# Patient Record
Sex: Female | Born: 1940
Health system: Southern US, Community
[De-identification: ages and names within clinical notes are randomized; demographics above are authoritative.]

---

## 2018-12-13 ENCOUNTER — Other Ambulatory Visit (HOSPITAL_COMMUNITY)
Admission: RE | Admit: 2018-12-13 | Discharge: 2018-12-13 | Disposition: A | Discharge status: Home / Self Care | Payer: Medicare HMO | Source: Ambulatory Visit | Attending: Infectious Diseases | Admitting: Infectious Diseases

## 2018-12-13 DIAGNOSIS — R0600 Dyspnea, unspecified: Secondary | ICD-10-CM | POA: Insufficient documentation

## 2018-12-13 DIAGNOSIS — Z955 Presence of coronary angioplasty implant and graft: Secondary | ICD-10-CM | POA: Diagnosis present

## 2018-12-13 LAB — COMPREHENSIVE METABOLIC PANEL
ALT: 28 U/L (ref 0–44)
AST: 17 U/L (ref 15–41)
Albumin: 2.7 g/dL — ABNORMAL LOW (ref 3.5–5.0)
Alkaline Phosphatase: 126 U/L (ref 38–126)
Anion gap: 10 (ref 5–15)
BUN: 13 mg/dL (ref 8–23)
CO2: 28 mmol/L (ref 22–32)
Calcium: 8.4 mg/dL — ABNORMAL LOW (ref 8.9–10.3)
Chloride: 100 mmol/L (ref 98–111)
Creatinine, Ser: 0.71 mg/dL (ref 0.44–1.00)
GFR calc Af Amer: >60 mL/min (ref >60)
GFR calc non Af Amer: >60 mL/min (ref >60)
Glucose, Bld: 289 mg/dL — ABNORMAL HIGH (ref 70–99)
Potassium: 4.1 mmol/L (ref 3.5–5.1)
Sodium: 138 mmol/L (ref 135–145)
Total Bilirubin: 0.3 mg/dL (ref 0.3–1.2)
Total Protein: 6 g/dL — ABNORMAL LOW (ref 6.5–8.1)

## 2018-12-13 LAB — CBC
HCT: 34.3 % — ABNORMAL LOW (ref 36.0–46.0)
Hemoglobin: 11 g/dL — ABNORMAL LOW (ref 12.0–15.0)
MCH: 27.9 pg (ref 26.0–34.0)
MCHC: 32.1 g/dL (ref 30.0–36.0)
MCV: 87.1 fL (ref 80.0–100.0)
Platelets: 181 10*3/uL (ref 150–400)
RBC: 3.94 MIL/uL (ref 3.87–5.11)
RDW: 13.3 % (ref 11.5–15.5)
WBC: 8.8 10*3/uL (ref 4.0–10.5)
nRBC: 0 % (ref 0.0–0.2)

## 2018-12-13 LAB — DIFFERENTIAL
Abs Immature Granulocytes: 0.05 10*3/uL (ref 0.00–0.07)
Basophils Absolute: 0 10*3/uL (ref 0.0–0.1)
Basophils Relative: 1 %
Eosinophils Absolute: 0.4 10*3/uL (ref 0.0–0.5)
Eosinophils Relative: 4 %
Immature Granulocytes: 1 %
Lymphocytes Relative: 22 %
Lymphs Abs: 1.9 10*3/uL (ref 0.7–4.0)
Monocytes Absolute: 0.6 10*3/uL (ref 0.1–1.0)
Monocytes Relative: 6 %
Neutro Abs: 5.9 10*3/uL (ref 1.7–7.7)
Neutrophils Relative %: 66 %

## 2018-12-17 ENCOUNTER — Other Ambulatory Visit (HOSPITAL_COMMUNITY)
Admission: RE | Admit: 2018-12-17 | Discharge: 2018-12-17 | Disposition: A | Discharge status: Home / Self Care | Payer: Medicare HMO | Source: Other Acute Inpatient Hospital | Attending: Infectious Diseases | Admitting: Infectious Diseases

## 2018-12-17 DIAGNOSIS — Z955 Presence of coronary angioplasty implant and graft: Secondary | ICD-10-CM | POA: Diagnosis not present

## 2018-12-17 DIAGNOSIS — R0600 Dyspnea, unspecified: Secondary | ICD-10-CM | POA: Diagnosis present

## 2018-12-17 LAB — COMPREHENSIVE METABOLIC PANEL
ALT: 30 U/L (ref 0–44)
AST: 29 U/L (ref 15–41)
Albumin: 3.2 g/dL — ABNORMAL LOW (ref 3.5–5.0)
Alkaline Phosphatase: 125 U/L (ref 38–126)
Anion gap: 11 (ref 5–15)
BUN: 16 mg/dL (ref 8–23)
CO2: 26 mmol/L (ref 22–32)
Calcium: 8.8 mg/dL — ABNORMAL LOW (ref 8.9–10.3)
Chloride: 98 mmol/L (ref 98–111)
Creatinine, Ser: 0.91 mg/dL (ref 0.44–1.00)
GFR calc Af Amer: >60 mL/min (ref >60)
GFR calc non Af Amer: >60 mL/min (ref >60)
Glucose, Bld: 281 mg/dL — ABNORMAL HIGH (ref 70–99)
Potassium: 4.8 mmol/L (ref 3.5–5.1)
Sodium: 135 mmol/L (ref 135–145)
Total Bilirubin: 0.2 mg/dL — ABNORMAL LOW (ref 0.3–1.2)
Total Protein: 6.8 g/dL (ref 6.5–8.1)

## 2018-12-17 LAB — CBC WITH DIFFERENTIAL/PLATELET
Abs Immature Granulocytes: 0.04 10*3/uL (ref 0.00–0.07)
Basophils Absolute: 0.1 10*3/uL (ref 0.0–0.1)
Basophils Relative: 1 %
Eosinophils Absolute: 0.3 10*3/uL (ref 0.0–0.5)
Eosinophils Relative: 3 %
HCT: 38.3 % (ref 36.0–46.0)
Hemoglobin: 11.9 g/dL — ABNORMAL LOW (ref 12.0–15.0)
Immature Granulocytes: 0 %
Lymphocytes Relative: 13 %
Lymphs Abs: 1.4 10*3/uL (ref 0.7–4.0)
MCH: 27.2 pg (ref 26.0–34.0)
MCHC: 31.1 g/dL (ref 30.0–36.0)
MCV: 87.4 fL (ref 80.0–100.0)
Monocytes Absolute: 0.4 10*3/uL (ref 0.1–1.0)
Monocytes Relative: 4 %
Neutro Abs: 8.1 10*3/uL — ABNORMAL HIGH (ref 1.7–7.7)
Neutrophils Relative %: 79 %
Platelets: 217 10*3/uL (ref 150–400)
RBC: 4.38 MIL/uL (ref 3.87–5.11)
RDW: 13.4 % (ref 11.5–15.5)
WBC: 10.2 10*3/uL (ref 4.0–10.5)
nRBC: 0 % (ref 0.0–0.2)

## 2018-12-24 ENCOUNTER — Other Ambulatory Visit (HOSPITAL_COMMUNITY)
Admission: RE | Admit: 2018-12-24 | Discharge: 2018-12-24 | Disposition: A | Discharge status: Home / Self Care | Payer: Medicare HMO | Source: Other Acute Inpatient Hospital | Attending: Infectious Diseases | Admitting: Infectious Diseases

## 2018-12-24 DIAGNOSIS — Z955 Presence of coronary angioplasty implant and graft: Secondary | ICD-10-CM | POA: Diagnosis not present

## 2018-12-24 DIAGNOSIS — R0600 Dyspnea, unspecified: Secondary | ICD-10-CM | POA: Insufficient documentation

## 2018-12-24 LAB — CBC WITH DIFFERENTIAL/PLATELET
Abs Immature Granulocytes: 0.02 10*3/uL (ref 0.00–0.07)
Basophils Absolute: 0.1 10*3/uL (ref 0.0–0.1)
Basophils Relative: 1 %
Eosinophils Absolute: 0.5 10*3/uL (ref 0.0–0.5)
Eosinophils Relative: 6 %
HCT: 34.9 % — ABNORMAL LOW (ref 36.0–46.0)
Hemoglobin: 10.8 g/dL — ABNORMAL LOW (ref 12.0–15.0)
Immature Granulocytes: 0 %
Lymphocytes Relative: 20 %
Lymphs Abs: 1.7 10*3/uL (ref 0.7–4.0)
MCH: 27.2 pg (ref 26.0–34.0)
MCHC: 30.9 g/dL (ref 30.0–36.0)
MCV: 87.9 fL (ref 80.0–100.0)
Monocytes Absolute: 0.5 10*3/uL (ref 0.1–1.0)
Monocytes Relative: 6 %
Neutro Abs: 5.8 10*3/uL (ref 1.7–7.7)
Neutrophils Relative %: 67 %
Platelets: 188 10*3/uL (ref 150–400)
RBC: 3.97 MIL/uL (ref 3.87–5.11)
RDW: 13.4 % (ref 11.5–15.5)
WBC: 8.6 10*3/uL (ref 4.0–10.5)
nRBC: 0 % (ref 0.0–0.2)

## 2018-12-24 LAB — COMPREHENSIVE METABOLIC PANEL
ALT: 33 U/L (ref 0–44)
AST: 20 U/L (ref 15–41)
Albumin: 2.7 g/dL — ABNORMAL LOW (ref 3.5–5.0)
Alkaline Phosphatase: 109 U/L (ref 38–126)
Anion gap: 8 (ref 5–15)
BUN: 16 mg/dL (ref 8–23)
CO2: 27 mmol/L (ref 22–32)
Calcium: 8.4 mg/dL — ABNORMAL LOW (ref 8.9–10.3)
Chloride: 101 mmol/L (ref 98–111)
Creatinine, Ser: 0.78 mg/dL (ref 0.44–1.00)
GFR calc Af Amer: >60 mL/min (ref >60)
GFR calc non Af Amer: >60 mL/min (ref >60)
Glucose, Bld: 386 mg/dL — ABNORMAL HIGH (ref 70–99)
Potassium: 4.3 mmol/L (ref 3.5–5.1)
Sodium: 136 mmol/L (ref 135–145)
Total Bilirubin: 0.7 mg/dL (ref 0.3–1.2)
Total Protein: 5.9 g/dL — ABNORMAL LOW (ref 6.5–8.1)

## 2018-12-31 ENCOUNTER — Other Ambulatory Visit (HOSPITAL_COMMUNITY)
Admission: RE | Admit: 2018-12-31 | Discharge: 2018-12-31 | Disposition: A | Discharge status: Home / Self Care | Payer: Medicare HMO | Source: Other Acute Inpatient Hospital | Attending: Infectious Diseases | Admitting: Infectious Diseases

## 2018-12-31 DIAGNOSIS — Z452 Encounter for adjustment and management of vascular access device: Secondary | ICD-10-CM | POA: Insufficient documentation

## 2018-12-31 LAB — CBC WITH DIFFERENTIAL/PLATELET
Abs Immature Granulocytes: 0.05 10*3/uL (ref 0.00–0.07)
Basophils Absolute: 0 10*3/uL (ref 0.0–0.1)
Basophils Relative: 0 %
Eosinophils Absolute: 0.2 10*3/uL (ref 0.0–0.5)
Eosinophils Relative: 2 %
HCT: 36.4 % (ref 36.0–46.0)
Hemoglobin: 11.3 g/dL — ABNORMAL LOW (ref 12.0–15.0)
Immature Granulocytes: 0 %
Lymphocytes Relative: 8 %
Lymphs Abs: 0.9 10*3/uL (ref 0.7–4.0)
MCH: 27.1 pg (ref 26.0–34.0)
MCHC: 31 g/dL (ref 30.0–36.0)
MCV: 87.3 fL (ref 80.0–100.0)
Monocytes Absolute: 0.3 10*3/uL (ref 0.1–1.0)
Monocytes Relative: 3 %
Neutro Abs: 10.2 10*3/uL — ABNORMAL HIGH (ref 1.7–7.7)
Neutrophils Relative %: 87 %
Platelets: 164 10*3/uL (ref 150–400)
RBC: 4.17 MIL/uL (ref 3.87–5.11)
RDW: 13.6 % (ref 11.5–15.5)
WBC: 11.8 10*3/uL — ABNORMAL HIGH (ref 4.0–10.5)
nRBC: 0 % (ref 0.0–0.2)

## 2018-12-31 LAB — COMPREHENSIVE METABOLIC PANEL
ALT: 34 U/L (ref 0–44)
AST: 22 U/L (ref 15–41)
Albumin: 2.9 g/dL — ABNORMAL LOW (ref 3.5–5.0)
Alkaline Phosphatase: 107 U/L (ref 38–126)
Anion gap: 9 (ref 5–15)
BUN: 20 mg/dL (ref 8–23)
CO2: 26 mmol/L (ref 22–32)
Calcium: 8.4 mg/dL — ABNORMAL LOW (ref 8.9–10.3)
Chloride: 99 mmol/L (ref 98–111)
Creatinine, Ser: 0.71 mg/dL (ref 0.44–1.00)
GFR calc Af Amer: >60 mL/min (ref >60)
GFR calc non Af Amer: >60 mL/min (ref >60)
Glucose, Bld: 397 mg/dL — ABNORMAL HIGH (ref 70–99)
Potassium: 4.5 mmol/L (ref 3.5–5.1)
Sodium: 134 mmol/L — ABNORMAL LOW (ref 135–145)
Total Bilirubin: 0.3 mg/dL (ref 0.3–1.2)
Total Protein: 6.4 g/dL — ABNORMAL LOW (ref 6.5–8.1)

## 2019-01-07 ENCOUNTER — Other Ambulatory Visit (HOSPITAL_COMMUNITY)
Admission: RE | Admit: 2019-01-07 | Discharge: 2019-01-07 | Disposition: A | Discharge status: Home / Self Care | Payer: Medicare HMO | Source: Other Acute Inpatient Hospital | Attending: Infectious Diseases | Admitting: Infectious Diseases

## 2019-01-07 DIAGNOSIS — R0600 Dyspnea, unspecified: Secondary | ICD-10-CM | POA: Insufficient documentation

## 2019-01-07 DIAGNOSIS — Z955 Presence of coronary angioplasty implant and graft: Secondary | ICD-10-CM | POA: Insufficient documentation

## 2019-01-07 LAB — CBC WITH DIFFERENTIAL/PLATELET
Abs Immature Granulocytes: 0.04 10*3/uL (ref 0.00–0.07)
Basophils Absolute: 0 10*3/uL (ref 0.0–0.1)
Basophils Relative: 0 %
Eosinophils Absolute: 0.2 10*3/uL (ref 0.0–0.5)
Eosinophils Relative: 2 %
HCT: 34.3 % — ABNORMAL LOW (ref 36.0–46.0)
Hemoglobin: 10.6 g/dL — ABNORMAL LOW (ref 12.0–15.0)
Immature Granulocytes: 1 %
Lymphocytes Relative: 13 %
Lymphs Abs: 1.1 10*3/uL (ref 0.7–4.0)
MCH: 27 pg (ref 26.0–34.0)
MCHC: 30.9 g/dL (ref 30.0–36.0)
MCV: 87.3 fL (ref 80.0–100.0)
Monocytes Absolute: 0.3 10*3/uL (ref 0.1–1.0)
Monocytes Relative: 4 %
Neutro Abs: 6.7 10*3/uL (ref 1.7–7.7)
Neutrophils Relative %: 80 %
Platelets: 154 10*3/uL (ref 150–400)
RBC: 3.93 MIL/uL (ref 3.87–5.11)
RDW: 13.7 % (ref 11.5–15.5)
WBC: 8.3 10*3/uL (ref 4.0–10.5)
nRBC: 0 % (ref 0.0–0.2)

## 2019-01-07 LAB — COMPREHENSIVE METABOLIC PANEL
ALT: 31 U/L (ref 0–44)
AST: 25 U/L (ref 15–41)
Albumin: 2.7 g/dL — ABNORMAL LOW (ref 3.5–5.0)
Alkaline Phosphatase: 92 U/L (ref 38–126)
Anion gap: 10 (ref 5–15)
BUN: 22 mg/dL (ref 8–23)
CO2: 24 mmol/L (ref 22–32)
Calcium: 8.5 mg/dL — ABNORMAL LOW (ref 8.9–10.3)
Chloride: 101 mmol/L (ref 98–111)
Creatinine, Ser: 0.74 mg/dL (ref 0.44–1.00)
GFR calc Af Amer: >60 mL/min (ref >60)
GFR calc non Af Amer: >60 mL/min (ref >60)
Glucose, Bld: 396 mg/dL — ABNORMAL HIGH (ref 70–99)
Potassium: 4.1 mmol/L (ref 3.5–5.1)
Sodium: 135 mmol/L (ref 135–145)
Total Bilirubin: 0.2 mg/dL — ABNORMAL LOW (ref 0.3–1.2)
Total Protein: 6.1 g/dL — ABNORMAL LOW (ref 6.5–8.1)

## 2019-01-14 ENCOUNTER — Other Ambulatory Visit (HOSPITAL_COMMUNITY)
Admission: RE | Admit: 2019-01-14 | Discharge: 2019-01-14 | Disposition: A | Discharge status: Home / Self Care | Payer: Medicare HMO | Source: Other Acute Inpatient Hospital | Attending: Infectious Diseases | Admitting: Infectious Diseases

## 2019-01-14 DIAGNOSIS — Z955 Presence of coronary angioplasty implant and graft: Secondary | ICD-10-CM | POA: Diagnosis not present

## 2019-01-14 DIAGNOSIS — R0600 Dyspnea, unspecified: Secondary | ICD-10-CM | POA: Diagnosis present

## 2019-01-14 LAB — COMPREHENSIVE METABOLIC PANEL
ALT: 26 U/L (ref 0–44)
AST: 23 U/L (ref 15–41)
Albumin: 3.1 g/dL — ABNORMAL LOW (ref 3.5–5.0)
Alkaline Phosphatase: 109 U/L (ref 38–126)
Anion gap: 10 (ref 5–15)
BUN: 15 mg/dL (ref 8–23)
CO2: 24 mmol/L (ref 22–32)
Calcium: 8.7 mg/dL — ABNORMAL LOW (ref 8.9–10.3)
Chloride: 103 mmol/L (ref 98–111)
Creatinine, Ser: 0.68 mg/dL (ref 0.44–1.00)
GFR calc Af Amer: >60 mL/min (ref >60)
GFR calc non Af Amer: >60 mL/min (ref >60)
Glucose, Bld: 234 mg/dL — ABNORMAL HIGH (ref 70–99)
Potassium: 4.5 mmol/L (ref 3.5–5.1)
Sodium: 137 mmol/L (ref 135–145)
Total Bilirubin: 0.4 mg/dL (ref 0.3–1.2)
Total Protein: 6.5 g/dL (ref 6.5–8.1)

## 2020-03-03 ENCOUNTER — Ambulatory Visit: Payer: Medicare HMO | Admitting: Podiatry

## 2020-03-10 ENCOUNTER — Ambulatory Visit: Payer: Medicare HMO | Admitting: Podiatry

## 2020-03-10 ENCOUNTER — Other Ambulatory Visit: Payer: Self-pay

## 2020-03-10 ENCOUNTER — Ambulatory Visit (INDEPENDENT_AMBULATORY_CARE_PROVIDER_SITE_OTHER): Payer: Medicare HMO

## 2020-03-10 ENCOUNTER — Other Ambulatory Visit: Payer: Self-pay | Admitting: Podiatry

## 2020-03-10 DIAGNOSIS — M722 Plantar fascial fibromatosis: Secondary | ICD-10-CM

## 2020-03-10 DIAGNOSIS — T8484XA Pain due to internal orthopedic prosthetic devices, implants and grafts, initial encounter: Secondary | ICD-10-CM

## 2020-03-10 DIAGNOSIS — M898X9 Other specified disorders of bone, unspecified site: Secondary | ICD-10-CM | POA: Diagnosis not present

## 2020-03-10 NOTE — Progress Notes (Signed)
  Subjective:  Patient ID: Betty Morales, female    DOB: 1940/06/06,  MRN: [id number] HPI Chief Complaint  Patient presents with  . Foot Problem    Lesion growth at LT plantar arch x 5 mo; w/ soreness - no redness/drainage Tx: Dr. Hilaria Ota inserts -worse with pressure or certain shoes    80 y.o. female presents with the above complaint.   ROS: Denies fever chills nausea vomiting muscle aches pains calf pain back pain chest pain shortness of breath.  No past medical history on file.  No current outpatient medications on file.  Allergies  Allergen Reactions  . Penicillins Anaphylaxis  . Lisinopril Other (See Comments)    HYPOTENSION HYPOTENSION HYPOTENSION   . Oxycodone-Acetaminophen Rash  . Phenobarbital Rash   Review of Systems Objective:  There were no vitals filed for this visit.  General: Well developed, nourished, in no acute distress, alert and oriented x3   Dermatological: Skin is warm, dry and supple bilateral. Nails x 10 are well maintained; remaining integument appears unremarkable at this time. There are no open sores, no preulcerative lesions, no rash or signs of infection present.  Vascular: Dorsalis Pedis artery and Posterior Tibial artery pedal pulses are 2/4 bilateral with immedate capillary fill time. Pedal hair growth present. No varicosities and no lower extremity edema present bilateral.   Neruologic: Grossly intact via light touch bilateral. Vibratory intact via tuning fork bilateral. Protective threshold with Semmes Wienstein monofilament intact to all pedal sites bilateral. Patellar and Achilles deep tendon reflexes 2+ bilateral. No Babinski or clonus noted bilateral.   Musculoskeletal: No gross boney pedal deformities bilateral. No pain, crepitus, or limitation noted with foot and ankle range of motion bilateral. Muscular strength 5/5 in all groups tested bilateral.  Large nonpulsatile mass plantar central aspect of the foot no open lesions or wounds.   There is small area of fluctuance over that but this appears to be bony.  Gait: Unassisted, Nonantalgic.    Radiographs:  Radiographs taken today demonstrate severe osteopenia with triple arthrodesis Lapidus procedure midfoot fusion internal fixation that is still present appears to be intact.  Radiographs did not demonstrate any type of bony mass on the plantar aspect of the foot due to the views.  Most likely this is hypertrophic bone from previous failed fusion.  Assessment & Plan:   Assessment: Most likely bony hypertrophy.  Plan: We are sending her for CT scan for evaluation of previously reconstructed foot with hypertrophic bone.     Idil Maslanka T. [place], [place]

## 2020-03-12 DIAGNOSIS — Z79899 Other long term (current) drug therapy: Secondary | ICD-10-CM | POA: Diagnosis not present

## 2020-03-12 DIAGNOSIS — M170 Bilateral primary osteoarthritis of knee: Secondary | ICD-10-CM | POA: Diagnosis not present

## 2020-03-12 DIAGNOSIS — Z6829 Body mass index (BMI) 29.0-29.9, adult: Secondary | ICD-10-CM | POA: Diagnosis not present

## 2020-03-12 DIAGNOSIS — R0781 Pleurodynia: Secondary | ICD-10-CM | POA: Diagnosis not present

## 2020-03-12 DIAGNOSIS — M0589 Other rheumatoid arthritis with rheumatoid factor of multiple sites: Secondary | ICD-10-CM | POA: Diagnosis not present

## 2020-03-12 DIAGNOSIS — J8410 Pulmonary fibrosis, unspecified: Secondary | ICD-10-CM | POA: Diagnosis not present

## 2020-03-12 DIAGNOSIS — M150 Primary generalized (osteo)arthritis: Secondary | ICD-10-CM | POA: Diagnosis not present

## 2020-03-12 DIAGNOSIS — E663 Overweight: Secondary | ICD-10-CM | POA: Diagnosis not present

## 2020-03-12 DIAGNOSIS — M2550 Pain in unspecified joint: Secondary | ICD-10-CM | POA: Diagnosis not present

## 2020-03-16 DIAGNOSIS — I639 Cerebral infarction, unspecified: Secondary | ICD-10-CM | POA: Diagnosis not present

## 2020-03-16 DIAGNOSIS — I252 Old myocardial infarction: Secondary | ICD-10-CM | POA: Diagnosis not present

## 2020-03-16 DIAGNOSIS — I350 Nonrheumatic aortic (valve) stenosis: Secondary | ICD-10-CM | POA: Diagnosis not present

## 2020-03-16 DIAGNOSIS — I2510 Atherosclerotic heart disease of native coronary artery without angina pectoris: Secondary | ICD-10-CM | POA: Diagnosis not present

## 2020-03-16 DIAGNOSIS — E7800 Pure hypercholesterolemia, unspecified: Secondary | ICD-10-CM | POA: Diagnosis not present

## 2020-03-16 DIAGNOSIS — G4733 Obstructive sleep apnea (adult) (pediatric): Secondary | ICD-10-CM | POA: Diagnosis not present

## 2020-03-16 DIAGNOSIS — J8410 Pulmonary fibrosis, unspecified: Secondary | ICD-10-CM | POA: Diagnosis not present

## 2020-03-16 DIAGNOSIS — I10 Essential (primary) hypertension: Secondary | ICD-10-CM | POA: Diagnosis not present

## 2020-03-17 DIAGNOSIS — R911 Solitary pulmonary nodule: Secondary | ICD-10-CM | POA: Diagnosis not present

## 2020-03-17 DIAGNOSIS — R0600 Dyspnea, unspecified: Secondary | ICD-10-CM | POA: Diagnosis not present

## 2020-03-17 DIAGNOSIS — J8410 Pulmonary fibrosis, unspecified: Secondary | ICD-10-CM | POA: Diagnosis not present

## 2020-03-17 DIAGNOSIS — G4733 Obstructive sleep apnea (adult) (pediatric): Secondary | ICD-10-CM | POA: Diagnosis not present

## 2020-03-17 DIAGNOSIS — R0602 Shortness of breath: Secondary | ICD-10-CM | POA: Diagnosis not present

## 2020-03-26 ENCOUNTER — Ambulatory Visit
Admission: RE | Admit: 2020-03-26 | Discharge: 2020-03-26 | Disposition: A | Discharge status: Home / Self Care | Payer: Medicare HMO | Source: Ambulatory Visit | Attending: Podiatry | Admitting: Podiatry

## 2020-03-26 DIAGNOSIS — Z981 Arthrodesis status: Secondary | ICD-10-CM | POA: Diagnosis not present

## 2020-03-26 DIAGNOSIS — T8484XA Pain due to internal orthopedic prosthetic devices, implants and grafts, initial encounter: Secondary | ICD-10-CM

## 2020-03-26 DIAGNOSIS — M898X9 Other specified disorders of bone, unspecified site: Secondary | ICD-10-CM

## 2020-03-26 DIAGNOSIS — R2242 Localized swelling, mass and lump, left lower limb: Secondary | ICD-10-CM | POA: Diagnosis not present

## 2020-03-26 DIAGNOSIS — M19072 Primary osteoarthritis, left ankle and foot: Secondary | ICD-10-CM | POA: Diagnosis not present

## 2020-03-26 DIAGNOSIS — Z9889 Other specified postprocedural states: Secondary | ICD-10-CM | POA: Diagnosis not present

## 2020-04-07 ENCOUNTER — Ambulatory Visit: Payer: Medicare HMO | Admitting: Podiatry

## 2020-04-07 ENCOUNTER — Other Ambulatory Visit: Payer: Self-pay

## 2020-04-07 ENCOUNTER — Encounter: Payer: Self-pay | Admitting: Podiatry

## 2020-04-07 DIAGNOSIS — T8484XA Pain due to internal orthopedic prosthetic devices, implants and grafts, initial encounter: Secondary | ICD-10-CM

## 2020-04-07 DIAGNOSIS — M898X9 Other specified disorders of bone, unspecified site: Secondary | ICD-10-CM

## 2020-04-07 NOTE — Progress Notes (Signed)
She presents today for follow-up of her CT results she states that the lesion is getting smaller and is less painful.  States that she was in [place] for a month and he was feeling much better.  Objective: Vital signs are stable alert and oriented x3 currently the lesion appears to be much smaller than it did previously there is still a palpable firm mass present no open lesions or wounds noted.  CT does demonstrate what appears to be rheumatoid nodules subcutaneous without significant bony prominences.  No signs of infection.  Assessment rheumatoid nodule plantar aspect left foot.  Plan: I recommended to her that at this point since it appears to be getting smaller we will let it get as small as it possibly can get and then once it stops reducing in size we will inject it with Kenalog try to shrink it prior to doing any type of surgical intervention.  She agrees with this and will follow up with me as needed

## 2020-05-25 DIAGNOSIS — R42 Dizziness and giddiness: Secondary | ICD-10-CM | POA: Diagnosis not present

## 2020-05-25 DIAGNOSIS — E1142 Type 2 diabetes mellitus with diabetic polyneuropathy: Secondary | ICD-10-CM | POA: Diagnosis not present

## 2020-05-25 DIAGNOSIS — Z794 Long term (current) use of insulin: Secondary | ICD-10-CM | POA: Diagnosis not present

## 2020-06-15 DIAGNOSIS — M150 Primary generalized (osteo)arthritis: Secondary | ICD-10-CM | POA: Diagnosis not present

## 2020-06-15 DIAGNOSIS — M2550 Pain in unspecified joint: Secondary | ICD-10-CM | POA: Diagnosis not present

## 2020-06-15 DIAGNOSIS — Z79899 Other long term (current) drug therapy: Secondary | ICD-10-CM | POA: Diagnosis not present

## 2020-06-15 DIAGNOSIS — E669 Obesity, unspecified: Secondary | ICD-10-CM | POA: Diagnosis not present

## 2020-06-15 DIAGNOSIS — M0589 Other rheumatoid arthritis with rheumatoid factor of multiple sites: Secondary | ICD-10-CM | POA: Diagnosis not present

## 2020-06-15 DIAGNOSIS — Z6830 Body mass index (BMI) 30.0-30.9, adult: Secondary | ICD-10-CM | POA: Diagnosis not present

## 2020-06-15 DIAGNOSIS — J8410 Pulmonary fibrosis, unspecified: Secondary | ICD-10-CM | POA: Diagnosis not present

## 2020-06-25 ENCOUNTER — Other Ambulatory Visit: Payer: Self-pay

## 2020-06-25 ENCOUNTER — Ambulatory Visit: Payer: Medicare HMO | Admitting: Podiatry

## 2021-06-08 IMAGING — CT CT FOOT*L* W/O CM
3 of 4 series · 10 of 33 positions shown, 12 images · non-contrast
Comparison: Radiographs 03/10/2020

CLINICAL DATA: Palpable lump on the plantar aspect of the foot.
History of multiple previous surgeries.

EXAM:
CT OF THE LEFT FOOT WITHOUT CONTRAST
TECHNIQUE: Multidetector CT imaging of the left foot was performed according to
the standard protocol. Multiplanar CT image reconstructions were
also generated.

[Series 8: lfov lower extremity 0.60 br40 s3 soft thin · axial · 0.54mm/px · z∈[+677,+728]mm · 2 of 215 slices shown, 3 images]
[im 65/215  soft-tissue]
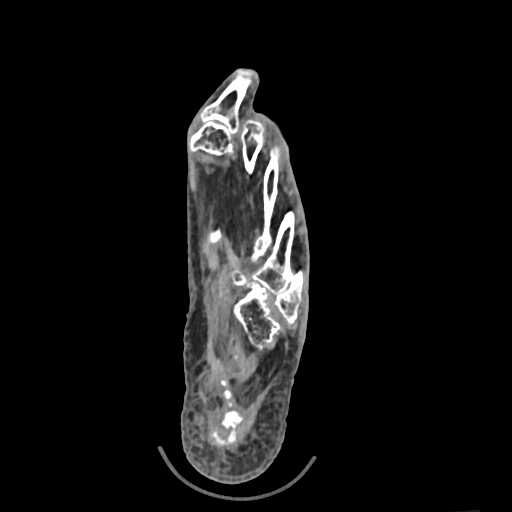
[im 65/215  bone]
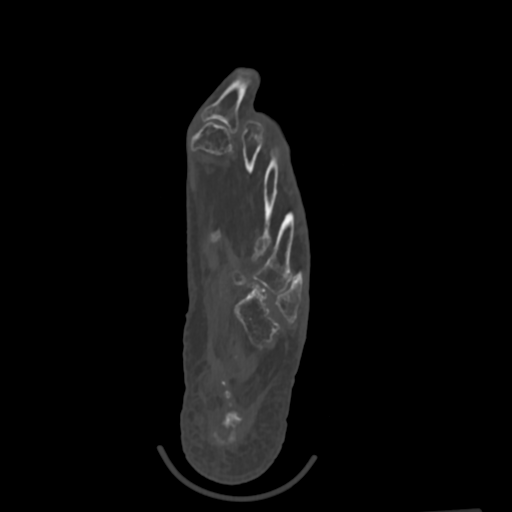
[im 150/215  bone]
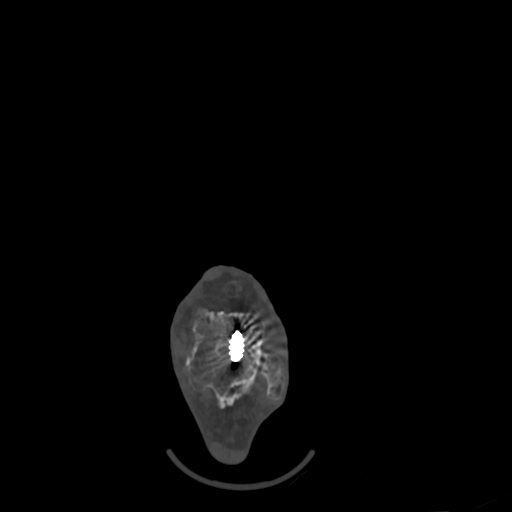

[Series 11: lfov lower extremity 2.00 br40 s3 soft · coronal · 0.25mm/px · 3 of 127 slices shown (1 of 2)]
[im 26/127  bone]
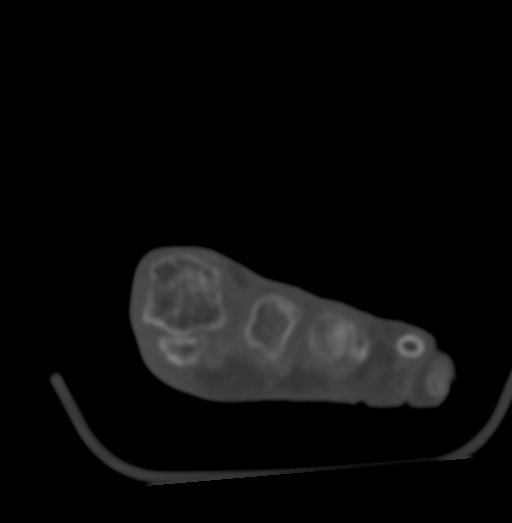
[im 51/127  bone]
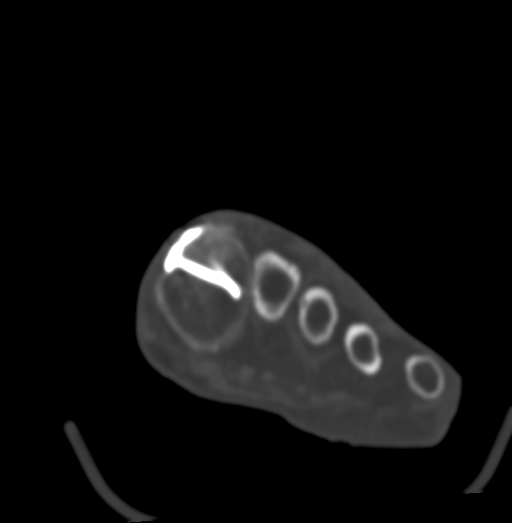
[im 76/127  bone]
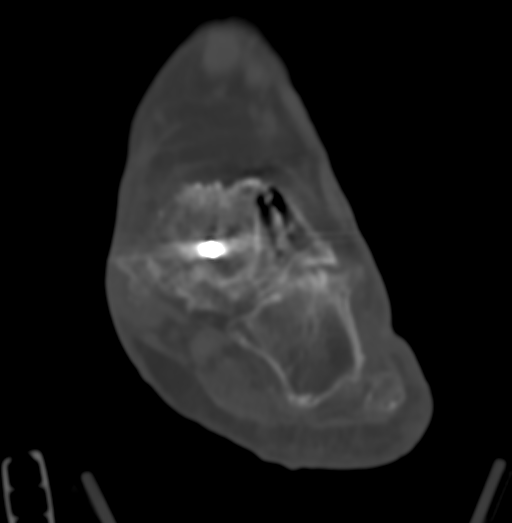

[Series 15: lfov lower extremity 2.00 br40 s3 soft · sagittal · 0.25mm/px · 5 of 63 slices shown, 6 images (2 of 2)]
[im 21/63  bone]
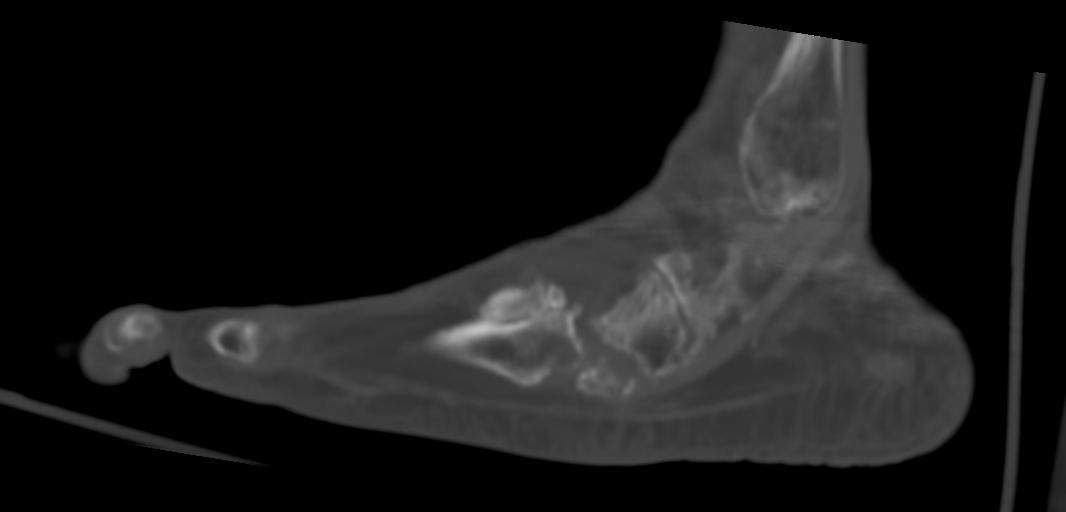
[im 26/63  bone]
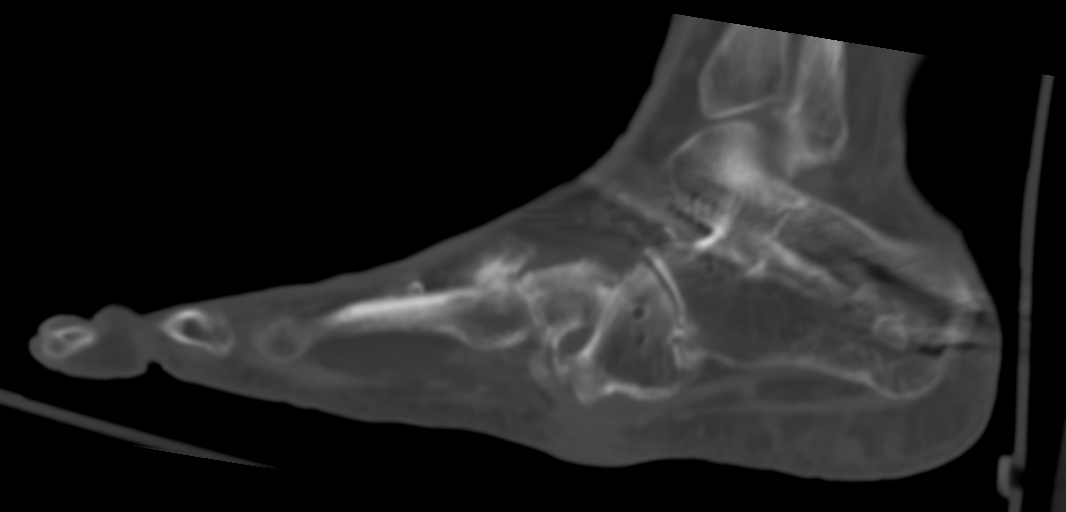
[im 32/63  soft-tissue]
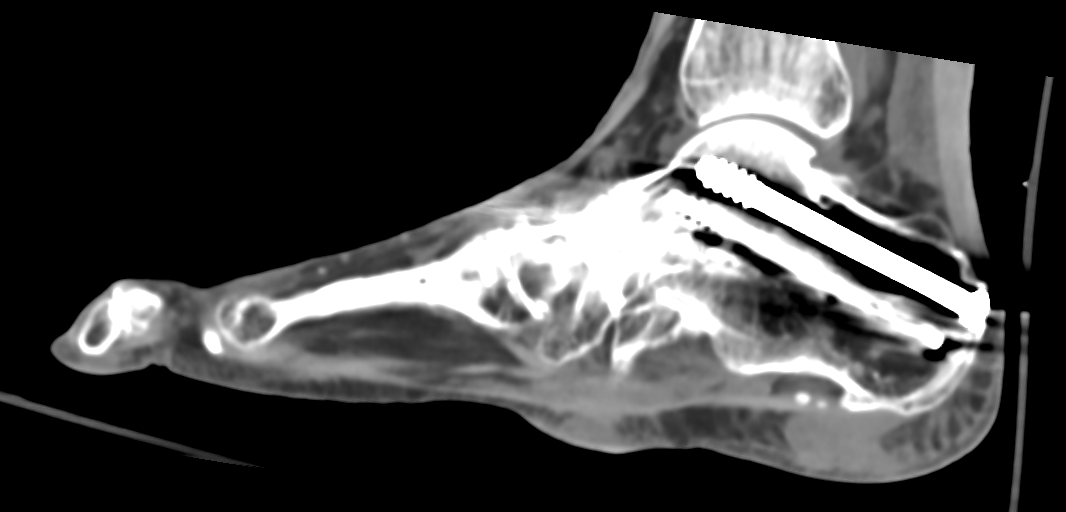
[im 32/63  bone]
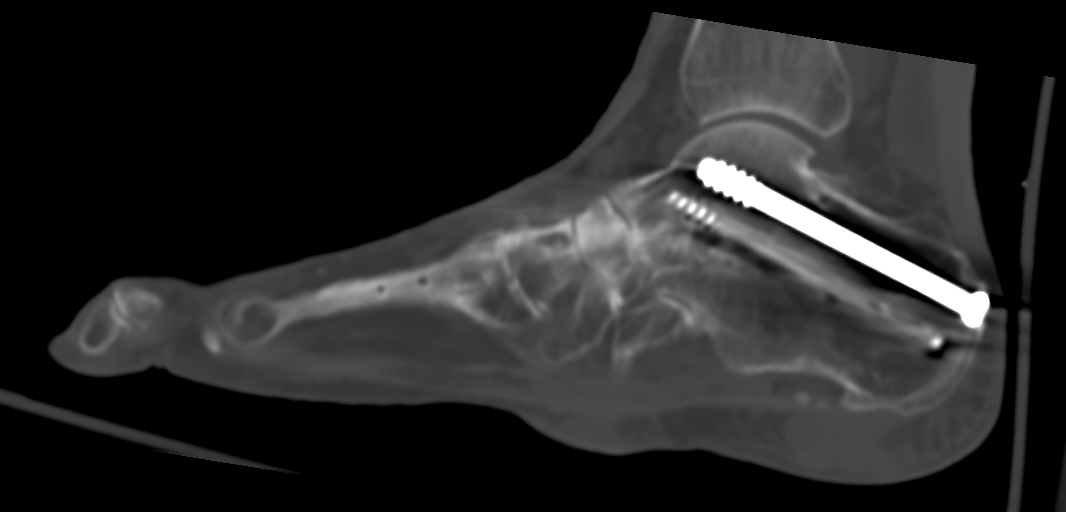
[im 37/63  bone]
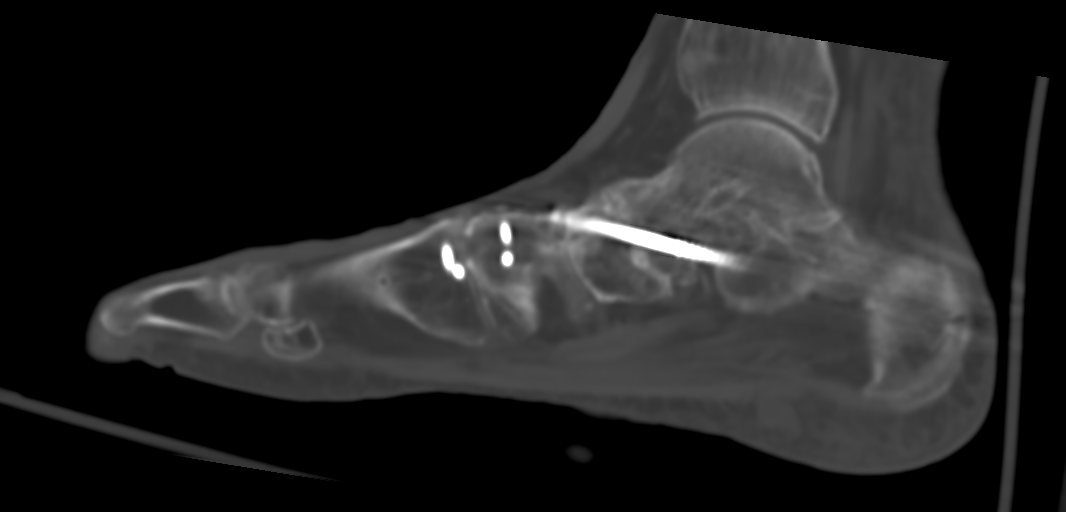
[im 42/63  bone]
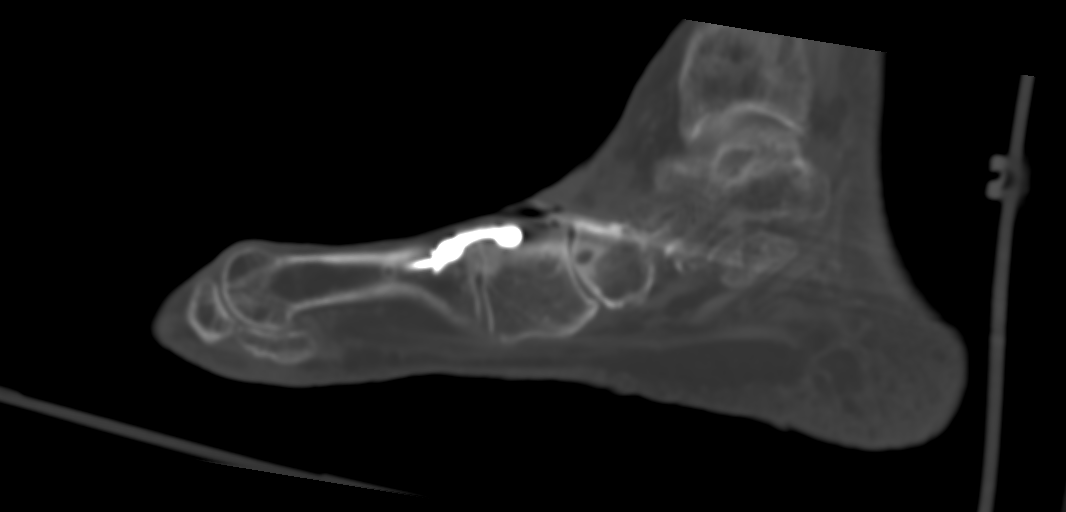

[10 of 33 positions shown; findings below may reference images not displayed]

FINDINGS: Extensive postoperative changes with hardware noted in the midfoot
and hindfoot regions.

There are 2 talocalcaneal screws related to a subtalar fusion. There
appears to be fairly solid bony fusion. There is also a screw across
the talonavicular joint which extends into the calcaneus also.
Incomplete fusion at this joint.

Fusion hardware noted across the first tarsal metatarsal joint with
fusion dorsally. Evidence of other areas of hardware removal.

Fairly extensive midfoot degenerative changes with partial fusion of
the talus with the lateral cuneiform and fusion across the lateral
cuneiform third metatarsal joint.

No acute bony findings such as fracture or osteomyelitis.

The patient's palpable abnormality is marked with a vitamin-E
capsule. There are actually several soft tissue nodules in the
plantar subcutaneous fat. These all have the same appearance and are
nonspecific. Although there are slight bony prominences along the
plantar aspect of the foot these do not measure fluid attenuation
and are un likely areas of adventitial bursa formation. They measure
is soft tissue nodules

The patient's palpable is located on the plantar aspect near the
cuboid and measures a maximum of 2 cm. More laterally there is a
smaller nodule located along the plantar aspect of the fifth
metatarsal base which measures 13 mm.

Larger lesion along the plantar aspect of the calcaneus measures
cm. There is also a smaller nodule more anteriorly measuring 11 mm.
No specific CT imaging features. With history of rheumatoid
arthritis it is possible these are rheumatoid nodules. They do not
have features of plantar fibromatosis.

Advanced fatty atrophy of the foot and ankle musculature is noted.

Mild degenerative changes at the tibiotalar joint.
IMPRESSION: 1. Extensive postoperative changes involving the midfoot and
hindfoot regions as discussed above.
2. Several soft tissue nodules in the plantar subcutaneous tissues
as discussed above. These are nonspecific. With history of
rheumatoid arthritis it is possible these are rheumatoid nodules.
They do not have the appearance of adventitial bursa or plantar
fibromatosis.
3. Advanced fatty atrophy of the foot and ankle musculature.

## 2022-06-08 ENCOUNTER — Other Ambulatory Visit: Payer: Self-pay

## 2022-06-08 ENCOUNTER — Encounter (HOSPITAL_BASED_OUTPATIENT_CLINIC_OR_DEPARTMENT_OTHER): Payer: Medicare (Managed Care) | Attending: Internal Medicine | Admitting: Physician Assistant

## 2022-06-08 DIAGNOSIS — J8410 Pulmonary fibrosis, unspecified: Secondary | ICD-10-CM | POA: Insufficient documentation

## 2022-06-08 DIAGNOSIS — I2510 Atherosclerotic heart disease of native coronary artery without angina pectoris: Secondary | ICD-10-CM | POA: Insufficient documentation

## 2022-06-08 DIAGNOSIS — G8193 Hemiplegia, unspecified affecting right nondominant side: Secondary | ICD-10-CM | POA: Diagnosis not present

## 2022-06-08 DIAGNOSIS — Z7901 Long term (current) use of anticoagulants: Secondary | ICD-10-CM | POA: Diagnosis not present

## 2022-06-08 DIAGNOSIS — L97312 Non-pressure chronic ulcer of right ankle with fat layer exposed: Secondary | ICD-10-CM | POA: Diagnosis not present

## 2022-06-08 DIAGNOSIS — I10 Essential (primary) hypertension: Secondary | ICD-10-CM | POA: Insufficient documentation

## 2022-06-08 DIAGNOSIS — L97512 Non-pressure chronic ulcer of other part of right foot with fat layer exposed: Secondary | ICD-10-CM | POA: Insufficient documentation

## 2022-06-08 DIAGNOSIS — I480 Paroxysmal atrial fibrillation: Secondary | ICD-10-CM | POA: Insufficient documentation

## 2022-06-08 DIAGNOSIS — L97522 Non-pressure chronic ulcer of other part of left foot with fat layer exposed: Secondary | ICD-10-CM | POA: Insufficient documentation

## 2022-06-08 DIAGNOSIS — E11621 Type 2 diabetes mellitus with foot ulcer: Secondary | ICD-10-CM | POA: Diagnosis present

## 2022-06-09 NOTE — Progress Notes (Signed)
Betty, Morales ([id number]) [id number] Page 1 of 4 Visit Report for 06/08/2022 Abuse Risk Screen Details Patient Name: Date of Service: Betty Morales [facility] RA 06/08/2022 1:30 PM Medical Record Number: [id number] Patient Account Number: [id number] Date of Birth/Sex: Treating RN: 04/03/1940 (82 y.o. Gevena Morales Primary Care Rebeca Valdivia: Tracey Harries Other Clinician: Referring Kylinn Shropshire: Treating Hildy Nicholl/Extender: Vaughan Sine in Treatment: 0 Abuse Risk Screen Items Answer ABUSE RISK SCREEN: Has anyone close to you tried to hurt or harm you recentlyo No Do you feel uncomfortable with anyone in your familyo No Has anyone forced you do things that you didnt want to doo No Electronic Signature(s) Signed: 06/09/2022 1:56:54 PM By: Brenton Grills Entered By: Brenton Grills on 06/08/2022 16:43:10 -------------------------------------------------------------------------------- Activities of Daily Living Details Patient Name: Date of Service: Betty Morales [facility] RA 06/08/2022 1:30 PM Medical Record Number: [id number] Patient Account Number: [id number] Date of Birth/Sex: Treating RN: 1941/01/29 (82 y.o. Gevena Morales Primary Care Arizbeth Cawthorn: Tracey Harries Other Clinician: Referring Reilynn Lauro: Treating Jacey Pelc/Extender: Vaughan Sine in Treatment: 0 Activities of Daily Living Items Answer Activities of Daily Living (Please select one for each item) Drive Automobile Not Able T Medications ake Need Assistance Use T elephone Need Assistance Care for Appearance Need Assistance Use T oilet Need Assistance Bath / Shower Need Assistance Dress Self Need Assistance Feed Self Need Assistance Walk Need Assistance Get In / Out Bed Need Assistance Housework Need Assistance Prepare Meals Need Assistance Handle Money Need Assistance Shop for Self Need Assistance Electronic Signature(s) Signed: 06/09/2022 1:56:54 PM By:  Brenton Grills Entered By: Brenton Grills on 06/08/2022 14:01:06 -------------------------------------------------------------------------------- Education Screening Details Patient Name: Date of Service: Betty Morales, [facility] RBA RA 06/08/2022 1:30 PM Medical Record Number: [id number] Patient Account Number: [id number] Date of Birth/Sex: Treating RN: [birth year] (82 y.o. Gevena Morales Primary Care Farrah Skoda: Tracey Harries Other Clinician: Referring Kenna Seward: Treating Karsen Nakanishi/Extender: Vaughan Sine in TreatmentZIMAL, WEISENSEL ([id number]) [id number] Page 2 of 4 Learning Preferences/Education Level/Primary Language Highest Education Level: College or Above Preferred Language: Economist Language Barrier: No Translator Needed: No Memory Deficit: No Emotional Barrier: No Cultural/Religious Beliefs Affecting Medical Care: No Physical Barrier Impaired Vision: Yes Glasses Impaired Hearing: No Decreased Hand dexterity: No Knowledge/Comprehension Knowledge Level: Medium Comprehension Level: Medium Ability to understand written instructions: Medium Ability to understand verbal instructions: Medium Motivation Anxiety Level: Calm Cooperation: Cooperative Education Importance: Acknowledges Need Interest in Health Problems: Asks Questions Perception: Coherent Willingness to Engage in Self-Management High Activities: Readiness to Engage in Self-Management High Activities: Electronic Signature(s) Signed: 06/09/2022 1:56:54 PM By: Brenton Grills Entered By: Brenton Grills on 06/08/2022 14:02:14 -------------------------------------------------------------------------------- Fall Risk Assessment Details Patient Name: Date of Service: Betty Morales, BA RBA RA 06/08/2022 1:30 PM Medical Record Number: [id number] Patient Account Number: [id number] Date of Birth/Sex: Treating RN: 04-Jan-1941 (82 y.o. Gevena Morales Primary Care  Arath Kaigler: Tracey Harries Other Clinician: Referring Marien Manship: Treating Rea Kalama/Extender: Vaughan Sine in Treatment: 0 Fall Risk Assessment Items Have you had 2 or more falls in the last 12 monthso 0 No Have you had any fall that resulted in injury in the last 12 monthso 0 Yes FALLS RISK SCREEN History of falling - immediate or within 3 months 25 Yes Secondary diagnosis (Do you have 2 or more medical diagnoseso) 15 Yes Ambulatory aid None/bed rest/wheelchair/nurse 0 Yes Crutches/cane/walker 0 No Furniture 0 No Intravenous therapy Access/Saline/Heparin Lock 0 No Gait/Transferring Normal/ bed  rest/ wheelchair 0 No Weak (short steps with or without shuffle, stooped but able to lift head while walking, may seek 0 No support from furniture) Impaired (short steps with shuffle, may have difficulty arising from chair, head down, impaired 20 Yes balance) Mental Status Oriented to own ability 0 No Electronic Signature(sLARAMIE, Morales ([id number]) [id number] Page 3 of 4 Signed: 06/09/2022 1:56:54 PM By: Brenton Grills Entered By: Brenton Grills on 06/08/2022 14:03:05 -------------------------------------------------------------------------------- Foot Assessment Details Patient Name: Date of Service: Betty Morales, [place] RBA RA 06/08/2022 1:30 PM Medical Record Number: [id number] Patient Account Number: [id number] Date of Birth/Sex: Treating RN: 1940/04/07 (82 y.o. Gevena Morales Primary Care Luvern Mischke: Tracey Harries Other Clinician: Referring Lilliana Turner: Treating Darely Becknell/Extender: Vaughan Sine in Treatment: 0 Foot Assessment Items  Unable to perform due to altered mental status Site Locations + = Sensation present, - = Sensation absent, C = Callus, U = Ulcer R = Redness, W = Warmth, M = Maceration, PU = Pre-ulcerative lesion F = Fissure, S = Swelling, D = Dryness Assessment Right: Left: Other Deformity: No  No Prior Foot Ulcer: No No Prior Amputation: No No Charcot Joint: No No Ambulatory Status: Gait: Electronic Signature(s) Signed: 06/09/2022 1:56:54 PM By: Brenton Grills Entered By: Brenton Grills on 06/08/2022 14:09:39 -------------------------------------------------------------------------------- Nutrition Risk Screening Details Patient Name: Date of Service: Betty Morales [facility] RA 06/08/2022 1:30 PM Medical Record Number: [id number] Patient Account Number: [id number] Date of Birth/Sex: Treating RN: 03-05-1940 (82 y.o. Gevena Morales Primary Care Nyanna Heideman: Tracey Harries Other Clinician: Referring Chantella Creech: Treating Jalena Vanderlinden/Extender: Vaughan Sine in Treatment: 0 Height (in): 67 Weight (lbs): 125 Body Mass Index (BMI): 19.6 Carriero, Kathyleen ([id number]) [phone] [id number] Page 4 of 4 Nutrition Risk Screening Items Score Screening NUTRITION RISK SCREEN: I have an illness or condition that made me change the kind and/or amount of food I eat 0 No I eat fewer than two meals per day 0 No I eat few fruits and vegetables, or milk products 0 No I have three or more drinks of beer, liquor or wine almost every day 0 No I have tooth or mouth problems that make it hard for me to eat 0 No I don't always have enough money to buy the food I need 0 No I eat alone most of the time 0 No I take three or more different prescribed or over-the-counter drugs a day 0 No Without wanting to, I have lost or gained 10 pounds in the last six months 0 No I am not always physically able to shop, cook and/or feed myself 0 No Nutrition Protocols Good Risk Protocol Moderate Risk Protocol High Risk Proctocol Risk Level: Good Risk Score: 0 Electronic Signature(s) Signed: 06/09/2022 1:56:54 PM By: Brenton Grills Entered By: Brenton Grills on 06/08/2022 14:03:31

## 2022-06-09 NOTE — Progress Notes (Signed)
KELISHA, DALL ([id number]) [id number] Page 1 of 16 Visit Report for 06/08/2022 Allergy List Details Patient Name: Date of Service: [name], [place] [facility] RA 06/08/2022 1:30 PM Medical Record Number: [id number] Patient Account Number: [id number] Date of Birth/Sex: Treating RN: 02-23-1940 (82 y.o. Gevena Mart Primary Care Akyla Vavrek: Tracey Harries Other Clinician: Referring Eilee Schader: Treating Dellar Traber/Extender: Vaughan Sine in Treatment: 0 Allergies Active Allergies penicillin G Reaction: anaphlaxis Severity: Severe Active: 04/12/2016 etanercept Reaction: injection reaction Active: 12/18/2020 infliximab Reaction: felt poorly Active: 12/18/2020 lisinopril Reaction: hypotension Active: 05/12/2015 phenobarbital Reaction: rash tramadol Reaction: acted different Active: 08/16/2021 Allergy Notes Electronic Signature(s) Signed: 06/09/2022 1:56:54 PM By: Brenton Grills Entered By: Brenton Grills on 06/08/2022 13:49:54 -------------------------------------------------------------------------------- Arrival Information Details Patient Name: Date of Service: [name], [facility] RBA RA 06/08/2022 1:30 PM Medical Record Number: [id number] Patient Account Number: [id number] Date of Birth/Sex: Treating RN: 1940/12/01 (82 y.o. Gevena Mart Primary Care Gussie Murton: Tracey Harries Other Clinician: Referring Rondle Lohse: Treating Marylene Masek/Extender: Vaughan Sine in Treatment: 0 Visit Information Patient Arrived: Wheel Chair Arrival Time: 13:43 Accompanied By: son Transfer Assistance: EasyPivot Patient Lift Patient Identification Verified: Yes Secondary Verification Process Completed: Yes Patient Requires Transmission-Based Precautions: No Patient Has Alerts: Yes Patient Alerts: Patient on Blood Thinner Patient on Eliquis Electronic Signature(s) Signed: 06/08/2022 5:06:53 PM By: Wynonia Hazard, Britta Mccreedy ([id number]) PM By:  Allen Derry PA-C [phone].pdf Page 2 of 16 Signed: 06/08/2022 5:06:53 Entered By: Allen Derry on 06/08/2022 17:06:52 -------------------------------------------------------------------------------- Clinic Level of Care Assessment Details Patient Name: Date of Service: Raylene Miyamoto [facility] RA 06/08/2022 1:30 PM Medical Record Number: [id number] Patient Account Number: [id number] Date of Birth/Sex: Treating RN: [birth year] (82 y.o. Gevena Mart Primary Care Jameca Chumley: Tracey Harries Other Clinician: Referring Yasuko Lapage: Treating Emigdio Wildeman/Extender: Vaughan Sine in Treatment: 0 Clinic Level of Care Assessment Items TOOL 1 Quantity Score X- 1 0 Use when EandM and Procedure is performed on INITIAL visit ASSESSMENTS - Nursing Assessment / Reassessment X- 1 20 General Physical Exam (combine w/ comprehensive assessment (listed just below) when performed on new pt. evals) X- 1 25 Comprehensive Assessment (HX, ROS, Risk Assessments, Wounds Hx, etc.) ASSESSMENTS - Wound and Skin Assessment / Reassessment X- 1 10 Dermatologic / Skin Assessment (not related to wound area) ASSESSMENTS - Ostomy and/or Continence Assessment and Care  - 0 Incontinence Assessment and Management  - 0 Ostomy Care Assessment and Management (repouching, etc.) PROCESS - Coordination of Care  - 0 Simple Patient / Family Education for ongoing care X- 1 20 Complex (extensive) Patient / Family Education for ongoing care X- 1 10 Staff obtains Chiropractor, Records, T Results / Process Orders est X- 1 10 Staff telephones HHA, Nursing Homes / Clarify orders / etc  - 0 Routine Transfer to another Facility (non-emergent condition)  - 0 Routine Hospital Admission (non-emergent condition) X- 1 15 New Admissions / Manufacturing engineer / Ordering NPWT Apligraf, etc. ,  - 0 Emergency Hospital Admission (emergent condition) PROCESS - Special Needs  - 0 Pediatric /  Minor Patient Management  - 0 Isolation Patient Management  - 0 Hearing / Language / Visual special needs  - 0 Assessment of Community assistance (transportation, D/C planning, etc.)  - 0 Additional assistance / Altered mentation  - 0 Support Surface(s) Assessment (bed, cushion, seat, etc.) INTERVENTIONS - Miscellaneous  - 0 External ear exam  - 0 Patient Transfer (multiple staff / Nurse, adult / Similar devices)  - 0 Simple Staple /  Suture removal (25 or less) []  - 0 Complex Staple / Suture removal (26 or more) []  - 0 Hypo/Hyperglycemic Management (do not check if billed separately) X- 1 15 Ankle / Brachial Index (ABI) - do not check if billed separately Has the patient been seen at the hospital within the last three years: Yes Total Score: 125 Level Of Care: New/Established - Level 4 Electronic Signature(s) Signed: 06/09/2022 1:56:54 PM By: Twanna Hy, Britta Mccreedy ([id number]) PM By: Brenton Grills [id number].pdf Page 3 of 16 Signed: 06/09/2022 1:56:54 Entered By: Brenton Grills on 06/08/2022 16:08:53 -------------------------------------------------------------------------------- Encounter Discharge Information Details Patient Name: Date of Service: Raylene Miyamoto [facility] RA 06/08/2022 1:30 PM Medical Record Number: [id number] Patient Account Number: [id number] Date of Birth/Sex: Treating RN: Dec 16, 1940 (82 y.o. Gevena Mart Primary Care Cloey Sferrazza: Tracey Harries Other Clinician: Referring Jeana Kersting: Treating Willie Loy/Extender: Vaughan Sine in Treatment: 0 Encounter Discharge Information Items Post Procedure Vitals Discharge Condition: Stable Temperature (F): 98.1 Ambulatory Status: Wheelchair Pulse (bpm): 56 Discharge Destination: Home Respiratory Rate (breaths/min): 18 Transportation: Private Auto Blood Pressure (mmHg): 100/56 Accompanied By: son Schedule Follow-up Appointment: Yes Clinical Summary of  Care: Patient Declined Electronic Signature(s) Signed: 06/09/2022 1:56:54 PM By: Brenton Grills Entered By: Brenton Grills on 06/08/2022 16:40:12 -------------------------------------------------------------------------------- Lower Extremity Assessment Details Patient Name: Date of Service: [name], [facility] RBA RA 06/08/2022 1:30 PM Medical Record Number: [id number] Patient Account Number: [id number] Date of Birth/Sex: Treating RN: January 09, 1941 (82 y.o. Gevena Mart Primary Care Jedidiah Demartini: Tracey Harries Other Clinician: Referring Sher Shampine: Treating Evangelia Whitaker/Extender: Vaughan Sine in Treatment: 0 Edema Assessment Assessed: Kyra Searles: Yes] Franne Forts: Yes] Edema: [Left: No] [Right: No] Calf Left: Right: Point of Measurement: From Medial Instep 27 cm 28 cm Ankle Left: Right: Point of Measurement: From Medial Instep 20.5 cm 22.2 cm Vascular Assessment Pulses: Dorsalis Pedis Palpable: [Left:Yes] [Right:Yes] Blood Pressure: Brachial: [Left:100] [Right:100] Ankle: [Left:Dorsalis Pedis: 98 0.98] [Right:Dorsalis Pedis: 98 0.98] Electronic Signature(s) Signed: 06/09/2022 1:56:54 PM By: Brenton Grills Entered By: Brenton Grills on 06/08/2022 14:16:33 Multi-Disciplinary Care Plan Details -------------------------------------------------------------------------------- Sherrye Payor ([id number]) [id number] Page 4 of 16 Patient Name: Date of Service: [name], [place] [facility] RA 06/08/2022 1:30 PM Medical Record Number: [id number] Patient Account Number: [id number] Date of Birth/Sex: Treating RN: Nov 04, 1940 (82 y.o. Gevena Mart Primary Care Harlee Eckroth: Tracey Harries Other Clinician: Referring Annalycia Done: Treating Raini Tiley/Extender: Vaughan Sine in Treatment: 0 Active Inactive Wound/Skin Impairment Nursing Diagnoses: Impaired tissue integrity Goals: Patient/caregiver will verbalize understanding of skin care regimen Date Initiated:  06/08/2022 Target Resolution Date: 06/30/2022 Goal Status: Active Interventions: Assess patient/caregiver ability to obtain necessary supplies Assess patient/caregiver ability to perform ulcer/skin care regimen upon admission and as needed Assess ulceration(s) every visit Provide education on ulcer and skin care Treatment Activities: Skin care regimen initiated : 06/08/2022 Topical wound management initiated : 06/08/2022 Notes: Electronic Signature(s) Signed: 06/09/2022 1:56:54 PM By: Brenton Grills Entered By: Brenton Grills on 06/08/2022 15:18:08 -------------------------------------------------------------------------------- Pain Assessment Details Patient Name: Date of Service: [name], [facility] RBA RA 06/08/2022 1:30 PM Medical Record Number: [id number] Patient Account Number: [id number] Date of Birth/Sex: Treating RN: 1940-09-30 (82 y.o. Gevena Mart Primary Care Shona Pardo: Tracey Harries Other Clinician: Referring Niyana Chesbro: Treating Alexys Lobello/Extender: Vaughan Sine in Treatment: 0 Active Problems Location of Pain Severity and Description of Pain Patient Has Paino No Site Locations Pain Management and Medication Current Pain Management: SHINE, SCROGHAM ([id number]) [id number] Page 5 of 16 Electronic Signature(s) Signed: 06/09/2022 1:56:54 PM  By: Isabelle Course By: Brenton Grills on 06/08/2022 14:52:15 -------------------------------------------------------------------------------- Patient/Caregiver Education Details Patient Name: Date of Service: [name], [facility] RBA RA 4/17/2024andnbsp1:30 PM Medical Record Number: [id number] Patient Account Number: [id number] Date of Birth/Gender: Treating RN: [birth year] (82 y.o. Gevena Mart Primary Care Physician: Tracey Harries Other Clinician: Referring Physician: Treating Physician/Extender: Vaughan Sine in Treatment: 0 Education Assessment Education Provided  To: Patient and Caregiver Education Topics Provided Nutrition: Methods: Explain/Verbal Responses: Reinforcements needed, State content correctly Welcome T [facility]-New [facility] Packet: o Handouts: Fall Prevention and Safe Transfers, Nutrition, Welcome T [facility] o Methods: Explain/Verbal, Printed Responses: Reinforcements needed, State content correctly Wound/Skin Impairment: Methods: Explain/Verbal Responses: Reinforcements needed, State content correctly Electronic Signature(s) Signed: 06/09/2022 1:56:54 PM By: Brenton Grills Entered By: Brenton Grills on 06/08/2022 15:19:39 -------------------------------------------------------------------------------- Wound Assessment Details Patient Name: Date of Service: [name], [facility] RBA RA 06/08/2022 1:30 PM Medical Record Number: [id number] Patient Account Number: [id number] Date of Birth/Sex: Treating RN: [birth year] (82 y.o. Gevena Mart Primary Care Travas Schexnayder: Tracey Harries Other Clinician: Referring Emary Zalar: Treating Ebonee Stober/Extender: Vaughan Sine in Treatment: 0 Wound Status Wound Number: 1 Primary Etiology: Diabetic Wound/Ulcer of the Lower Extremity Wound Location: Right, Lateral Ankle Wound Status: Open Wounding Event: Blister Comorbid History: Hypertension, Type II Diabetes Date Acquired: 02/21/2022 Weeks Of Treatment: 0 Clustered Wound: No Photos MARLEE, ARMENTEROS ([id number]) [id number] Page 6 of 16 Wound Measurements Length: (cm) 2.2 Width: (cm) 1.7 Depth: (cm) 0.3 Area: (cm) 2.937 Volume: (cm) 0.881 % Reduction in Area: % Reduction in Volume: Epithelialization: None Tunneling: No Undermining: No Wound Description Classification: Grade 2 Wound Margin: Distinct, outline attached Exudate Amount: Medium Exudate Type: Serosanguineous Exudate Color: red, brown Foul Odor After Cleansing: No Slough/Fibrino No Wound Bed Granulation Amount:  Medium (34-66%) Exposed Structure Granulation Quality: Red, Pink Fat Layer (Subcutaneous Tissue) Exposed: Yes Necrotic Amount: Medium (34-66%) Necrotic Quality: Adherent Slough Periwound Skin Texture Texture Color No Abnormalities Noted: Yes No Abnormalities Noted: No Hemosiderin Staining: Yes Moisture No Abnormalities Noted: No Dry / Scaly: No Maceration: No Electronic Signature(s) Signed: 06/09/2022 1:56:54 PM By: Brenton Grills Entered By: Brenton Grills on 06/08/2022 14:45:01 -------------------------------------------------------------------------------- Wound Assessment Details Patient Name: Date of Service: [name], [facility] RBA RA 06/08/2022 1:30 PM Medical Record Number: [id number] Patient Account Number: [id number] Date of Birth/Sex: Treating RN: 1940-03-14 (82 y.o. Gevena Mart Primary Care Mesiah Manzo: Tracey Harries Other Clinician: Referring Judah Chevere: Treating Baneen Wieseler/Extender: Vaughan Sine in Treatment: 0 Wound Status Wound Number: 2 Primary Etiology: Diabetic Wound/Ulcer of the Lower Extremity Wound Location: Right, Dorsal T Great oe Wound Status: Open Wounding Event: Blister Comorbid History: Hypertension, Type II Diabetes Date Acquired: 02/21/2022 Weeks Of Treatment: 0 Clustered Wound: No Pending Amputation On Presentation Photos KIERSTEN, COSS ([id number]) [id number] Page 7 of 16 Wound Measurements Length: (cm) 2 Width: (cm) 1.1 Depth: (cm) 0.2 Area: (cm) 1.728 Volume: (cm) 0.346 % Reduction in Area: % Reduction in Volume: Epithelialization: None Tunneling: No Undermining: No Wound Description Classification: Grade 2 Wound Margin: Distinct, outline attached Exudate Amount: Medium Exudate Type: Serosanguineous Exudate Color: red, brown Foul Odor After Cleansing: No Slough/Fibrino No Wound Bed Granulation Amount: None Present (0%) Exposed Structure Necrotic Amount: Large (67-100%) Fascia Exposed:  No Necrotic Quality: Eschar Fat Layer (Subcutaneous Tissue) Exposed: Yes Tendon Exposed: No Muscle Exposed: No Joint Exposed: No Bone Exposed: Yes Periwound Skin Texture Texture Color No Abnormalities Noted: Yes No Abnormalities Noted: No Atrophie Blanche: No  Moisture Cyanosis: No No Abnormalities Noted: No Ecchymosis: No Dry / Scaly: No Erythema: No Maceration: No Hemosiderin Staining: Yes Mottled: No Pallor: No Rubor: No Temperature / Pain Temperature: No Abnormality Treatment Notes Wound #2 (Toe Great) Wound Laterality: Dorsal, Right Cleanser Vashe 5.8 (oz) Discharge Instruction: Cleanse the wound with Vashe prior to applying a clean dressing using gauze sponges, not tissue or cotton balls. Peri-Wound Care Topical Primary Dressing MediHoney Gel, tube 1.5 (oz) Discharge Instruction: Apply to wound bed as instructed Xeroform Occlusive Gauze Dressing, 4x4 in Discharge Instruction: Apply to wound bed as instructed Secondary Dressing Bandaid Secured With Compression Erline Hau ([id number]) [id number] Page 8 of 16 Compression Stockings Add-Ons Electronic Signature(s) Signed: 06/09/2022 1:56:54 PM By: Brenton Grills Entered By: Brenton Grills on 06/08/2022 15:46:19 -------------------------------------------------------------------------------- Wound Assessment Details Patient Name: Date of Service: [name], [place] RBA RA 06/08/2022 1:30 PM Medical Record Number: [id number] Patient Account Number: [id number] Date of Birth/Sex: Treating RN: 08-20-1940 (82 y.o. Gevena Mart Primary Care Wilkie Zenon: Tracey Harries Other Clinician: Referring Keisuke Hollabaugh: Treating Akeria Hedstrom/Extender: Vaughan Sine in Treatment: 0 Wound Status Wound Number: 3 Primary Etiology: Diabetic Wound/Ulcer of the Lower Extremity Wound Location: Right, Dorsal T Second oe Wound Status: Open Wounding Event: Blister Comorbid History:  Hypertension, Type II Diabetes Date Acquired: 02/21/2022 Weeks Of Treatment: 0 Clustered Wound: No Pending Amputation On Presentation Photos Wound Measurements Length: (cm) 1.3 Width: (cm) 1.2 Depth: (cm) 0.1 Area: (cm) 1.225 Volume: (cm) 0.123 % Reduction in Area: % Reduction in Volume: Epithelialization: None Tunneling: No Undermining: No Wound Description Classification: Grade 2 Wound Margin: Distinct, outline attached Exudate Amount: Medium Exudate Type: Serosanguineous Exudate Color: red, brown Foul Odor After Cleansing: No Slough/Fibrino No Wound Bed Granulation Amount: None Present (0%) Exposed Structure Necrotic Amount: Large (67-100%) Fascia Exposed: No Necrotic Quality: Eschar Fat Layer (Subcutaneous Tissue) Exposed: Yes Tendon Exposed: No Muscle Exposed: No Joint Exposed: No Bone Exposed: Yes Periwound Skin Texture Texture Color No Abnormalities Noted: Yes No Abnormalities Noted: No Atrophie Blanche: No Moisture Cyanosis: No No Abnormalities Noted: No Ecchymosis: No Dry / Scaly: No Erythema: No Maceration: No Hemosiderin Staining: No SHEKELA, GOODRIDGE ([id number]) [id number] Page 9 of 16 Mottled: No Pallor: No Rubor: No Temperature / Pain Temperature: No Abnormality Treatment Notes Wound #3 (Toe Second) Wound Laterality: Dorsal, Right Cleanser Vashe 5.8 (oz) Discharge Instruction: Cleanse the wound with Vashe prior to applying a clean dressing using gauze sponges, not tissue or cotton balls. Peri-Wound Care Topical Primary Dressing MediHoney Gel, tube 1.5 (oz) Discharge Instruction: Apply to wound bed as instructed Xeroform Occlusive Gauze Dressing, 4x4 in Discharge Instruction: Apply to wound bed as instructed Secondary Dressing Bandaid Secured With Compression Wrap Compression Stockings Add-Ons Electronic Signature(s) Signed: 06/09/2022 1:56:54 PM By: Brenton Grills Entered By: Brenton Grills on 06/08/2022  15:47:04 -------------------------------------------------------------------------------- Wound Assessment Details Patient Name: Date of Service: [name], [facility] RBA RA 06/08/2022 1:30 PM Medical Record Number: [id number] Patient Account Number: [id number] Date of Birth/Sex: Treating RN: [birth year] (82 y.o. Gevena Mart Primary Care Jarrah Seher: Tracey Harries Other Clinician: Referring Lucindia Lemley: Treating Omkar Stratmann/Extender: Vaughan Sine in Treatment: 0 Wound Status Wound Number: 4 Primary Etiology: Diabetic Wound/Ulcer of the Lower Extremity Wound Location: Right, Dorsal T Third oe Wound Status: Open Wounding Event: Blister Comorbid History: Hypertension, Type II Diabetes Date Acquired: 02/21/2022 Weeks Of Treatment: 0 Clustered Wound: No Pending Amputation On Presentation Photos Wound Measurements MORNINGSTAR, TOFT ([id number]) Length: (cm) 0.9 Width: (cm) 1.2 Depth: (cm) 0.1 Area: (  cm) 0.848 Volume: (cm) 0.085 [id number] Page 10 of 16 % Reduction in Area: % Reduction in Volume: Epithelialization: None Tunneling: No Undermining: No Wound Description Classification: Grade 2 Wound Margin: Distinct, outline attached Exudate Amount: Medium Exudate Type: Serosanguineous Exudate Color: red, brown Foul Odor After Cleansing: No Slough/Fibrino No Wound Bed Granulation Amount: None Present (0%) Exposed Structure Necrotic Amount: Large (67-100%) Fascia Exposed: No Necrotic Quality: Eschar Fat Layer (Subcutaneous Tissue) Exposed: Yes Tendon Exposed: No Muscle Exposed: No Joint Exposed: No Bone Exposed: Yes Periwound Skin Texture Texture Color No Abnormalities Noted: Yes No Abnormalities Noted: No Atrophie Blanche: No Moisture Cyanosis: No No Abnormalities Noted: No Ecchymosis: No Dry / Scaly: No Erythema: No Maceration: No Hemosiderin Staining: No Mottled: No Pallor: No Rubor: No Treatment Notes Wound #4 (Toe Third)  Wound Laterality: Dorsal, Right Cleanser Vashe 5.8 (oz) Discharge Instruction: Cleanse the wound with Vashe prior to applying a clean dressing using gauze sponges, not tissue or cotton balls. Peri-Wound Care Topical Primary Dressing MediHoney Gel, tube 1.5 (oz) Discharge Instruction: Apply to wound bed as instructed Xeroform Occlusive Gauze Dressing, 4x4 in Discharge Instruction: Apply to wound bed as instructed Secondary Dressing Bandaid Secured With Compression Wrap Compression Stockings Add-Ons Electronic Signature(s) Signed: 06/09/2022 1:56:54 PM By: Brenton Grills Entered By: Brenton Grills on 06/08/2022 15:47:23 -------------------------------------------------------------------------------- Wound Assessment Details Patient Name: Date of Service: [name], [facility] RBA RA 06/08/2022 1:30 PM Medical Record Number: [id number] Patient Account Number: [id number] Date of Birth/Sex: Treating RN: March 30, 1940 (82 y.o. Gevena Mart Primary Care Dejah Droessler: Tracey Harries Other Clinician: Sherrye Payor ([id number]) [id number] Page 11 of 16 Referring Aamya Orellana: Treating Adaysha Dubinsky/Extender: Vaughan Sine in Treatment: 0 Wound Status Wound Number: 5 Primary Etiology: Diabetic Wound/Ulcer of the Lower Extremity Wound Location: Right, Medial Foot Wound Status: Open Wounding Event: Gradually Appeared Comorbid History: Hypertension, Type II Diabetes Date Acquired: 02/21/2022 Weeks Of Treatment: 0 Clustered Wound: No Photos Wound Measurements Length: (cm) 0.3 Width: (cm) 0.3 Depth: (cm) 0.1 Area: (cm) 0.071 Volume: (cm) 0.007 % Reduction in Area: % Reduction in Volume: Epithelialization: None Tunneling: No Undermining: No Wound Description Classification: Grade 2 Wound Margin: Distinct, outline attached Exudate Amount: Medium Exudate Type: Serosanguineous Exudate Color: red, brown Foul Odor After Cleansing: No Slough/Fibrino  No Wound Bed Granulation Amount: None Present (0%) Exposed Structure Necrotic Amount: Large (67-100%) Fat Layer (Subcutaneous Tissue) Exposed: Yes Necrotic Quality: Eschar Periwound Skin Texture Texture Color No Abnormalities Noted: Yes No Abnormalities Noted: No Atrophie Blanche: No Moisture Cyanosis: No No Abnormalities Noted: No Ecchymosis: No Dry / Scaly: No Erythema: No Maceration: No Hemosiderin Staining: No Mottled: No Pallor: No Rubor: No Temperature / Pain Temperature: No Abnormality Treatment Notes Wound #5 (Foot) Wound Laterality: Right, Medial Cleanser Vashe 5.8 (oz) Discharge Instruction: Cleanse the wound with Vashe prior to applying a clean dressing using gauze sponges, not tissue or cotton balls. Peri-Wound Care Topical Primary Dressing MediHoney Gel, tube 1.5 (oz) Discharge Instruction: Apply to wound bed as instructed DE, LIBMAN ([id number]) [id number].pdf Page 12 of 16 Xeroform Occlusive Gauze Dressing, 4x4 in Discharge Instruction: Apply to wound bed as instructed Secondary Dressing Zetuvit Plus Silicone Border Dressing 4x4 (in/in) Discharge Instruction: Apply silicone border over primary dressing as directed. Secured With Compression Wrap Compression Stockings Add-Ons Electronic Signature(s) Signed: 06/09/2022 1:56:54 PM By: Brenton Grills Entered By: Brenton Grills on 06/08/2022 15:48:02 -------------------------------------------------------------------------------- Wound Assessment Details Patient Name: Date of Service: [name], [facility] RBA RA 06/08/2022 1:30 PM Medical Record Number: [id number] Patient Account Number: [id number]  Date of Birth/Sex: Treating RN: 1940-12-05 (82 y.o. Gevena Mart Primary Care Skarlet Lyons: Tracey Harries Other Clinician: Referring Taralee Marcus: Treating Aubra Pappalardo/Extender: Vaughan Sine in Treatment: 0 Wound Status Wound Number: 6 Primary Etiology: Diabetic Wound/Ulcer  of the Lower Extremity Wound Location: Left, Dorsal T Great oe Wound Status: Open Wounding Event: Blister Comorbid History: Hypertension, Type II Diabetes Date Acquired: 02/21/2022 Weeks Of Treatment: 0 Clustered Wound: No Pending Amputation On Presentation Photos Wound Measurements Length: (cm) 1.5 Width: (cm) 1.3 Depth: (cm) 0.2 Area: (cm) 1.532 Volume: (cm) 0.306 % Reduction in Area: % Reduction in Volume: Epithelialization: None Tunneling: No Undermining: No Wound Description Classification: Grade 2 Wound Margin: Distinct, outline attached Exudate Amount: Medium Exudate Type: Serosanguineous Exudate Color: red, brown Foul Odor After Cleansing: No Slough/Fibrino No Wound Bed Granulation Amount: Medium (34-66%) Exposed Structure Granulation Quality: Red Fascia Exposed: No Necrotic Amount: Medium (34-66%) Fat Layer (Subcutaneous Tissue) Exposed: Yes Necrotic Quality: Eschar Tendon Exposed: No Muscle Exposed: No Joint Exposed: No Sherrye Payor ([id number]) [id number] Page 13 of 16 Bone Exposed: Yes Periwound Skin Texture Texture Color No Abnormalities Noted: Yes No Abnormalities Noted: No Atrophie Blanche: No Moisture Cyanosis: No No Abnormalities Noted: No Ecchymosis: No Dry / Scaly: No Erythema: No Maceration: No Hemosiderin Staining: No Mottled: No Pallor: No Rubor: No Temperature / Pain Temperature: No Abnormality Treatment Notes Wound #6 (Toe Great) Wound Laterality: Dorsal, Left Cleanser Vashe 5.8 (oz) Discharge Instruction: Cleanse the wound with Vashe prior to applying a clean dressing using gauze sponges, not tissue or cotton balls. Peri-Wound Care Topical Primary Dressing MediHoney Gel, tube 1.5 (oz) Discharge Instruction: Apply to wound bed as instructed Xeroform Occlusive Gauze Dressing, 4x4 in Discharge Instruction: Apply to wound bed as instructed Secondary Dressing Bandaid Secured With Compression  Wrap Compression Stockings Add-Ons Electronic Signature(s) Signed: 06/09/2022 1:56:54 PM By: Brenton Grills Entered By: Brenton Grills on 06/08/2022 15:48:36 -------------------------------------------------------------------------------- Wound Assessment Details Patient Name: Date of Service: [name], [facility] RBA RA 06/08/2022 1:30 PM Medical Record Number: [id number] Patient Account Number: [id number] Date of Birth/Sex: Treating RN: 10-12-1940 (82 y.o. Gevena Mart Primary Care Keianna Signer: Tracey Harries Other Clinician: Referring Juliett Eastburn: Treating Jiaire Rosebrook/Extender: Vaughan Sine in Treatment: 0 Wound Status Wound Number: 7 Primary Etiology: Diabetic Wound/Ulcer of the Lower Extremity Wound Location: Left, Dorsal T Third oe Wound Status: Open Wounding Event: Blister Comorbid History: Hypertension, Type II Diabetes Date Acquired: 02/21/2022 Weeks Of Treatment: 0 Clustered Wound: No Pending Amputation On Presentation Photos KALECIA, HARTNEY ([id number]) [id number] Page 14 of 16 Wound Measurements Length: (cm) 1.6 Width: (cm) 1.1 Depth: (cm) 0.1 Area: (cm) 1.382 Volume: (cm) 0.138 % Reduction in Area: % Reduction in Volume: Epithelialization: None Tunneling: No Undermining: No Wound Description Classification: Grade 2 Wound Margin: Flat and Intact Exudate Amount: Medium Exudate Type: Serosanguineous Exudate Color: red, brown Foul Odor After Cleansing: No Slough/Fibrino No Wound Bed Granulation Amount: Medium (34-66%) Exposed Structure Granulation Quality: Red Fascia Exposed: No Necrotic Amount: Medium (34-66%) Fat Layer (Subcutaneous Tissue) Exposed: Yes Necrotic Quality: Eschar Tendon Exposed: No Muscle Exposed: No Joint Exposed: No Bone Exposed: Yes Periwound Skin Texture Texture Color No Abnormalities Noted: Yes No Abnormalities Noted: No Atrophie Blanche: No Moisture Cyanosis: No No Abnormalities Noted:  No Ecchymosis: No Dry / Scaly: No Erythema: No Maceration: No Hemosiderin Staining: No Mottled: No Pallor: No Rubor: No Treatment Notes Wound #7 (Toe Third) Wound Laterality: Dorsal, Left Cleanser Vashe 5.8 (oz) Discharge Instruction: Cleanse the wound with Vashe prior to  applying a clean dressing using gauze sponges, not tissue or cotton balls. Peri-Wound Care Topical Primary Dressing MediHoney Gel, tube 1.5 (oz) Discharge Instruction: Apply to wound bed as instructed Xeroform Occlusive Gauze Dressing, 4x4 in Discharge Instruction: Apply to wound bed as instructed Secondary Dressing Bandaid Secured With Compression Wrap Compression Stockings Add-Ons LAURYN, LIZARDI ([id number]) [id number] Page 15 of 16 Electronic Signature(s) Signed: 06/09/2022 1:56:54 PM By: Brenton Grills Entered By: Brenton Grills on 06/08/2022 15:49:00 -------------------------------------------------------------------------------- Wound Assessment Details Patient Name: Date of Service: [name], [place] RBA RA 06/08/2022 1:30 PM Medical Record Number: [id number] Patient Account Number: [id number] Date of Birth/Sex: Treating RN: 1940/07/05 (82 y.o. Gevena Mart Primary Care Jasmain Ahlberg: Tracey Harries Other Clinician: Referring Shi Grose: Treating Paxten Appelt/Extender: Vaughan Sine in Treatment: 0 Wound Status Wound Number: 8 Primary Etiology: Diabetic Wound/Ulcer of the Lower Extremity Wound Location: Anterior Calcaneus Wound Status: Open Wounding Event: Blister Comorbid History: Hypertension, Type II Diabetes Date Acquired: 02/21/2022 Weeks Of Treatment: 0 Clustered Wound: No Photos Wound Measurements Length: (cm) 0.9 Width: (cm) 0.7 Depth: (cm) 0.1 Area: (cm) 0.495 Volume: (cm) 0.049 % Reduction in Area: % Reduction in Volume: Epithelialization: None Tunneling: No Undermining: No Wound Description Classification: Grade 2 Wound Margin: Flat  and Intact Exudate Amount: Medium Exudate Type: Serosanguineous Exudate Color: red, brown Foul Odor After Cleansing: No Slough/Fibrino No Wound Bed Granulation Amount: Medium (34-66%) Exposed Structure Granulation Quality: Red Fascia Exposed: No Necrotic Amount: Medium (34-66%) Fat Layer (Subcutaneous Tissue) Exposed: Yes Necrotic Quality: Eschar Tendon Exposed: No Muscle Exposed: No Joint Exposed: No Bone Exposed: No Periwound Skin Texture Texture Color No Abnormalities Noted: Yes No Abnormalities Noted: No Atrophie Blanche: No Moisture Cyanosis: No No Abnormalities Noted: No Ecchymosis: No Dry / Scaly: No Erythema: No Maceration: No Hemosiderin Staining: No Mottled: No Pallor: No Rubor: No ASHLEYMARIE, GRANDERSON ([id number]) [id number] Page 16 of 16 Temperature / Pain Temperature: No Abnormality Treatment Notes Wound #8 (Calcaneus) Wound Laterality: Anterior Cleanser Vashe 5.8 (oz) Discharge Instruction: Cleanse the wound with Vashe prior to applying a clean dressing using gauze sponges, not tissue or cotton balls. Peri-Wound Care Topical Primary Dressing MediHoney Gel, tube 1.5 (oz) Discharge Instruction: Apply to wound bed as instructed Xeroform Occlusive Gauze Dressing, 4x4 in Discharge Instruction: Apply to wound bed as instructed Secondary Dressing Zetuvit Plus Silicone Border Dressing 4x4 (in/in) Discharge Instruction: Apply silicone border over primary dressing as directed. Secured With Compression Wrap Compression Stockings Facilities manager) Signed: 06/09/2022 1:56:54 PM By: Brenton Grills Entered By: Brenton Grills on 06/08/2022 15:49:31 -------------------------------------------------------------------------------- Vitals Details Patient Name: Date of Service: [name], [facility] RBA RA 06/08/2022 1:30 PM Medical Record Number: [id number] Patient Account Number: [id number] Date of Birth/Sex: Treating RN: August 29, 1940 (82  y.o. Gevena Mart Primary Care Randon Somera: Tracey Harries Other Clinician: Referring Marquell Saenz: Treating Calina Patrie/Extender: Vaughan Sine in Treatment: 0 Vital Signs Time Taken: 13:44 Temperature (F): 97.1 Height (in): 67 Pulse (bpm): 57 Weight (lbs): 125 Respiratory Rate (breaths/min): 18 Body Mass Index (BMI): 19.6 Blood Pressure (mmHg): 100/56 Capillary Blood Glucose (mg/dl): 536 Reference Range: 80 - 120 mg / dl Electronic Signature(s) Signed: 06/09/2022 1:56:54 PM By: Brenton Grills Entered By: Brenton Grills on 06/08/2022 13:49:42

## 2022-06-09 NOTE — Progress Notes (Signed)
JAZLIN, TAPSCOTT ([id number]) [id number] Page 1 of 16 Visit Report for 06/08/2022 Chief Complaint Document Details Patient Name: Date of Service: Betty Morales, [place] [facility] RA 06/08/2022 1:30 PM Medical Record Number: [id number] Patient Account Number: [id number] Date of Birth/Sex: Treating RN: [birth year] (82 y.o. F) Primary Care Provider: Tracey Harries Other Clinician: Referring Provider: Treating Provider/Extender: Vaughan Sine in Treatment: 0 Information Obtained from: Patient Chief Complaint Bilateral LE Ulcers Electronic Signature(s) Signed: 06/08/2022 3:13:35 PM By: Allen Derry PA-C Entered By: Allen Derry on 06/08/2022 15:13:35 -------------------------------------------------------------------------------- Debridement Details Patient Name: Date of Service: Betty Morales, [facility] RBA RA 06/08/2022 1:30 PM Medical Record Number: [id number] Patient Account Number: [id number] Date of Birth/Sex: Treating RN: Jul 04, 1940 (82 y.o. Betty Morales Primary Care Provider: Tracey Harries Other Clinician: Referring Provider: Treating Provider/Extender: Vaughan Sine in Treatment: 0 Debridement Performed for Assessment: Wound #7 Left,Dorsal T Third oe Performed By: Physician Lenda Kelp, PA Debridement Type: Debridement Severity of Tissue Pre Debridement: Bone involvement without necrosis Level of Consciousness (Pre-procedure): Awake and Alert Pre-procedure Verification/Time Out Yes - 15:32 Taken: Start Time: 15:34 Pain Control: Lidocaine 4% T opical Solution T Area Debrided (L x W): otal 1.6 (cm) x 1.1 (cm) = 1.76 (cm) Tissue and other material debrided: Eschar, Slough, Subcutaneous, Slough Level: Skin/Subcutaneous Tissue Debridement Description: Excisional Instrument: Curette Bleeding: Minimum Hemostasis Achieved: Pressure Response to Treatment: Procedure was tolerated well Level of Consciousness (Post- Awake and  Alert procedure): Post Debridement Measurements of Total Wound Length: (cm) 1.6 Width: (cm) 1.1 Depth: (cm) 0.1 Volume: (cm) 0.138 Character of Wound/Ulcer Post Debridement: Stable Severity of Tissue Post Debridement: Bone involvement without necrosis Post Procedure Diagnosis Same as Pre-procedure Notes Scribed for Dr. Leonard Schwartz by Brenton Grills RN. Electronic Signature(s) Signed: 06/08/2022 6:42:55 PM By: Allen Derry PA-C Signed: 06/09/2022 1:56:54 PM By: Brenton Grills Entered By: Brenton Grills on 06/08/2022 15:39:02 Betty Morales ([id number]) [id number] Page 2 of 16 -------------------------------------------------------------------------------- Debridement Details Patient Name: Date of Service: Betty Morales [facility] RA 06/08/2022 1:30 PM Medical Record Number: [id number] Patient Account Number: [id number] Date of Birth/Sex: Treating RN: 05/28/1940 (82 y.o. Betty Morales Primary Care Provider: Tracey Harries Other Clinician: Referring Provider: Treating Provider/Extender: Vaughan Sine in Treatment: 0 Debridement Performed for Assessment: Wound #2 Right,Dorsal T Great oe Performed By: Physician Lenda Kelp, PA Debridement Type: Debridement Severity of Tissue Pre Debridement: Bone involvement without necrosis Level of Consciousness (Pre-procedure): Awake and Alert Pre-procedure Verification/Time Out Yes - 15:32 Taken: Start Time: 15:34 Pain Control: Lidocaine 4% T opical Solution T Area Debrided (L x W): otal 2 (cm) x 1.1 (cm) = 2.2 (cm) Tissue and other material debrided: Viable, Non-Viable, Eschar, Slough, Subcutaneous, Slough Level: Skin/Subcutaneous Tissue Debridement Description: Excisional Instrument: Curette Bleeding: Minimum Hemostasis Achieved: Pressure End Time: 15:45 Procedural Pain: 0 Post Procedural Pain: 0 Response to Treatment: Procedure was tolerated well Level of Consciousness (Post- Awake and  Alert procedure): Post Debridement Measurements of Total Wound Length: (cm) 2 Width: (cm) 1.1 Depth: (cm) 0.2 Volume: (cm) 0.346 Character of Wound/Ulcer Post Debridement: Stable Severity of Tissue Post Debridement: Bone involvement without necrosis Post Procedure Diagnosis Same as Pre-procedure Notes Scribed for Dr. Leonard Schwartz by Brenton Grills RN. Electronic Signature(s) Signed: 06/08/2022 6:42:55 PM By: Allen Derry PA-C Signed: 06/09/2022 1:56:54 PM By: Brenton Grills Entered By: Brenton Grills on 06/08/2022 15:51:03 -------------------------------------------------------------------------------- Debridement Details Patient Name: Date of Service: Betty Morales, [facility] RBA RA 06/08/2022 1:30 PM Medical Record Number: [id number] Patient Account  Number: [id number] Date of Birth/Sex: Treating RN: [birth year] (82 y.o. Betty Morales Primary Care Provider: Tracey Harries Other Clinician: Referring Provider: Treating Provider/Extender: Vaughan Sine in Treatment: 0 Debridement Performed for Assessment: Wound #4 Right,Dorsal T Third oe Performed By: Physician Lenda Kelp, PA Debridement Type: Debridement Severity of Tissue Pre Debridement: Bone involvement without necrosis Level of Consciousness (Pre-procedure): Awake and Alert Pre-procedure Verification/Time Out Yes - 15:32 Taken: Start Time: 15:34 Pain Control: Lidocaine 4% T opical Solution T Area Debrided (L x W): otal 0.9 (cm) x 1.2 (cm) = 1.08 (cm) Tissue and other material debrided: Viable, Non-Viable, Salome Arnt, Subcutaneous, Hedrick, Talala ([id number]) [phone].pdf Page 3 of 16 Level: Skin/Subcutaneous Tissue Debridement Description: Excisional Instrument: Curette Bleeding: Minimum Hemostasis Achieved: Pressure End Time: 15:45 Procedural Pain: 0 Post Procedural Pain: 0 Response to Treatment: Procedure was tolerated well Level of Consciousness (Post- Awake and  Alert procedure): Post Debridement Measurements of Total Wound Length: (cm) 0.9 Width: (cm) 1.2 Depth: (cm) 0.1 Volume: (cm) 0.085 Character of Wound/Ulcer Post Debridement: Stable Severity of Tissue Post Debridement: Bone involvement without necrosis Post Procedure Diagnosis Same as Pre-procedure Notes Scribed for Dr. Leonard Schwartz by Brenton Grills RN. Electronic Signature(s) Signed: 06/08/2022 6:42:55 PM By: Allen Derry PA-C Signed: 06/09/2022 1:56:54 PM By: Brenton Grills Entered By: Brenton Grills on 06/08/2022 15:55:02 -------------------------------------------------------------------------------- Debridement Details Patient Name: Date of Service: Betty Morales, [facility] RBA RA 06/08/2022 1:30 PM Medical Record Number: [id number] Patient Account Number: [id number] Date of Birth/Sex: Treating RN: 1941-01-06 (82 y.o. Betty Morales Primary Care Provider: Tracey Harries Other Clinician: Referring Provider: Treating Provider/Extender: Vaughan Sine in Treatment: 0 Debridement Performed for Assessment: Wound #6 Left,Dorsal T Great oe Performed By: Physician Lenda Kelp, PA Debridement Type: Debridement Severity of Tissue Pre Debridement: Bone involvement without necrosis Level of Consciousness (Pre-procedure): Awake and Alert Pre-procedure Verification/Time Out Yes - 15:32 Taken: Start Time: 15:34 Pain Control: Lidocaine 4% T opical Solution T Area Debrided (L x W): otal 1.5 (cm) x 1.3 (cm) = 1.95 (cm) Tissue and other material debrided: Viable, Non-Viable, Bone, Eschar, Slough, Subcutaneous, Slough Level: Skin/Subcutaneous Tissue/Muscle/Bone Debridement Description: Excisional Instrument: Curette Specimen: Swab, Number of Specimens T aken: 1 Bleeding: Minimum Hemostasis Achieved: Pressure End Time: 15:45 Procedural Pain: 0 Post Procedural Pain: 0 Response to Treatment: Procedure was tolerated well Level of Consciousness (Post- Awake and  Alert procedure): Post Debridement Measurements of Total Wound Length: (cm) 1.5 Width: (cm) 1.3 Depth: (cm) 0.1 Volume: (cm) 0.153 Character of Wound/Ulcer Post Debridement: Stable Severity of Tissue Post Debridement: Bone involvement without necrosis Betty Morales ([id number]) [id number] Page 4 of 16 Post Procedure Diagnosis Same as Pre-procedure Notes Scribed for Dr. Leonard Schwartz by Brenton Grills RN. Bone Biopsy taken. Electronic Signature(s) Signed: 06/08/2022 6:42:55 PM By: Allen Derry PA-C Signed: 06/09/2022 1:56:54 PM By: Brenton Grills Entered By: Brenton Grills on 06/08/2022 15:56:13 -------------------------------------------------------------------------------- Debridement Details Patient Name: Date of Service: Betty Morales, [facility] RBA RA 06/08/2022 1:30 PM Medical Record Number: [id number] Patient Account Number: [id number] Date of Birth/Sex: Treating RN: Nov 09, 1940 (82 y.o. Betty Morales Primary Care Provider: Tracey Harries Other Clinician: Referring Provider: Treating Provider/Extender: Vaughan Sine in Treatment: 0 Debridement Performed for Assessment: Wound #3 Right,Dorsal T Second oe Performed By: Physician Lenda Kelp, PA Debridement Type: Debridement Severity of Tissue Pre Debridement: Bone involvement without necrosis Level of Consciousness (Pre-procedure): Awake and Alert Pre-procedure Verification/Time Out Yes - 15:32 Taken: Start Time: 15:34 Pain Control: Lidocaine  4% T opical Solution T Area Debrided (L x W): otal 1.3 (cm) x 1.2 (cm) = 1.56 (cm) Tissue and other material debrided: Viable, Non-Viable, Bone, Eschar, Slough, Subcutaneous, Slough Level: Skin/Subcutaneous Tissue/Muscle/Bone Debridement Description: Excisional Instrument: Curette Specimen: Swab, Number of Specimens T aken: 2 Bleeding: Minimum Hemostasis Achieved: Pressure End Time: 15:45 Procedural Pain: 0 Post Procedural Pain: 0 Response to  Treatment: Procedure was tolerated well Level of Consciousness (Post- Awake and Alert procedure): Post Debridement Measurements of Total Wound Length: (cm) 1.3 Width: (cm) 1.2 Depth: (cm) 0.1 Volume: (cm) 0.123 Character of Wound/Ulcer Post Debridement: Stable Severity of Tissue Post Debridement: Bone involvement without necrosis Post Procedure Diagnosis Same as Pre-procedure Notes Scribed for Dr. Leonard Schwartz by Brenton Grills RN. Bone culture and biopsy taken. Electronic Signature(s) Signed: 06/08/2022 6:42:55 PM By: Allen Derry PA-C Signed: 06/09/2022 2:41:21 PM By: Shawn Stall RN, BSN Entered By: Shawn Stall on 06/08/2022 16:39:30 -------------------------------------------------------------------------------- HPI Details Patient Name: Date of Service: Betty Morales, BA RBA RA 06/08/2022 1:30 PM Medical Record Number: [id number] Patient Account Number: [id number] Betty Morales, Betty Morales ([id number]) [id number] Page 5 of 16 Date of Birth/Sex: Treating RN: 07-01-1940 (82 y.o. F) Primary Care Provider: Other Clinician: Tracey Harries Referring Provider: Treating Provider/Extender: Vaughan Sine in Treatment: 0 History of Present Illness HPI Description: 06-08-2022 upon evaluation today patient presents for initial inspection here in our clinic concerning issues that she has been having with wounds over her right lower extremity and bilateral feet. There really is not a cause for what exactly has led to these as she seems to have good arterial flow she has had this checked according to her son when she was in the hospital at [facility] They told her that everything was fine from a blood flow standpoint. Also we did check her ABIs today and they seem to be doing well at 0.98 bilaterally. Again everything seems to point to having good blood flow. She does however have wounds that are in areas of prominence that seem to be more of a rubbing type situation.  Apparently she has had wounds like this in the past and then subsequently they have had to treat them the home health nurse that is and have gotten better. She is in assisted living facility. With that being said unfortunately with this wound things just have not gotten better and has been going on since at least the first of the year her signs not exactly sure when this really began to be honest. He is also not exactly sure where the orders for current wound care measures have come from Betadine and keeping things dry as what is been utilized up to this point. With that being said I do not think this could be appropriate going forward. Patient does have a hemoglobin A1c that was on March 21, 2022 which was 6.1. I also did review the CBC which showed a hemoglobin of 12.2 with hematocrit of 38. On her CMP of note her total protein was 6.1 with albumin of 2.9 and all the labs except for the A1c were performed on 01-14-2022. She also other than the diabetes mellitus type 2 has a history of pulmonary fibrosis, atrial fibrillation with long-term use of anticoagulants with Eliquis, coronary artery disease, hemiplegia affecting her right nondominant side and also her speech, and hypertension. Electronic Signature(s) Signed: 06/08/2022 4:38:11 PM By: Allen Derry PA-C Entered By: Allen Derry on 06/08/2022 16:38:11 -------------------------------------------------------------------------------- Physical Exam Details Patient Name: Date of Service: Betty Morales, BA RBA RA 06/08/2022  1:30 PM Medical Record Number: [id number] Patient Account Number: [id number] Date of Birth/Sex: Treating RN: 1940-12-11 (82 y.o. F) Primary Care Provider: Tracey Harries Other Clinician: Referring Provider: Treating Provider/Extender: Vaughan Sine in Treatment: 0 Constitutional sitting or standing blood pressure is within target range for patient.. pulse regular and within target range for patient.Marland Kitchen  respirations regular, non-labored and within target range for patient.Marland Kitchen temperature within target range for patient.. Well-nourished and well-hydrated in no acute distress. Eyes conjunctiva clear no eyelid edema noted. pupils equal round and reactive to light and accommodation. Ears, Nose, Mouth, and Throat no gross abnormality of ear auricles or external auditory canals. normal hearing noted during conversation. mucus membranes moist. Respiratory normal breathing without difficulty. Cardiovascular 1+ dorsalis pedis/posterior tibialis pulses. no clubbing, cyanosis, significant edema, <3 sec cap refill. Musculoskeletal Patient unable to walk without assistance. Psychiatric this patient is able to make decisions to a degree but does have some limitation in expressing herself secondary to the stroke. Alert and Oriented x 3. pleasant and cooperative. Notes Upon inspection patient's wound bed actually showed signs at all locations on the toes of actually having bone exposed unfortunately. I did not see any bone exposed on her foot wounds nor on the leg wound at this point which is good news. Nonetheless it really seems like all of the areas of open wounds or areas of bony prominence which unfortunately has led to breakdown and with this bone exposure some of the bone is actually very loose and I think is going to be easily sampled for pathology and culture. I think that something that we should do in order to make sure that we are treating her appropriately for what is going on here. I think that she has osteomyelitis based on what I am seeing currently. These are all toes great to ulcerations possibly grade 3 depending on the results of the pathology and culture. Electronic Signature(s) Signed: 06/08/2022 4:41:26 PM By: Allen Derry PA-C Entered By: Allen Derry on 06/08/2022 16:41:26 Betty Morales ([id number]) [id number] Page 6 of  16 -------------------------------------------------------------------------------- Physician Orders Details Patient Name: Date of Service: Betty Morales [facility] RA 06/08/2022 1:30 PM Medical Record Number: [id number] Patient Account Number: [id number] Date of Birth/Sex: Treating RN: [birth year] (82 y.o. Betty Morales Primary Care Provider: Tracey Harries Other Clinician: Referring Provider: Treating Provider/Extender: Vaughan Sine in Treatment: 0 Verbal / Phone Orders: No Diagnosis Coding ICD-10 Coding Code Description E11.621 Type 2 diabetes mellitus with foot ulcer L97.312 Non-pressure chronic ulcer of right ankle with fat layer exposed 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 I10 Essential (primary) hypertension G81.93 Hemiplegia, unspecified affecting right nondominant side I48.0 Paroxysmal atrial fibrillation J84.10 Pulmonary fibrosis, unspecified Z79.01 Long term (current) use of anticoagulants I25.10 Atherosclerotic heart disease of native coronary artery without angina pectoris Follow-up Appointments ppointment in 1 week. Leonard Schwartz [place] Wednesday 1230 06/15/2022 room 7 Return A extra time 60 minutes Other: - culture and biopsy- once results are back will call you. Anesthetic (In clinic) Topical Lidocaine 4% applied to wound bed Bathing/ Shower/ Hygiene May shower with protection but do not get wound dressing(s) wet. Protect dressing(s) with water repellant cover (for example, large plastic bag) or a cast cover and may then take shower. Off-Loading Open toe surgical shoe to: - Bilateral feet Additional Orders / Instructions Follow Nutritious Diet - increase  protein Home Health New wound care orders this week; continue Home Health for wound care. May utilize formulary equivalent dressing for wound treatment orders unless  otherwise specified. - [facility] please see patient twice a week. wound center weekly. Other Home Health Orders/Instructions: - [facility] Wound Treatment Wound #1 - Ankle Wound Laterality: Right, Lateral Cleanser: Vashe 5.8 (oz) (Home Health) 3 x Per Week/30 Days Discharge Instructions: Cleanse the wound with Vashe prior to applying a clean dressing using gauze sponges, not tissue or cotton balls. Prim Dressing: MediHoney Gel, tube 1.5 (oz) (Home Health) 3 x Per Week/30 Days ary Discharge Instructions: Apply to wound bed as instructed Prim Dressing: Xeroform Occlusive Gauze Dressing, 4x4 in (Home Health) 3 x Per Week/30 Days ary Discharge Instructions: Apply to wound bed as instructed Secondary Dressing: Zetuvit Plus Silicone Border Dressing 4x4 (in/in) (Home Health) 3 x Per Week/30 Days Discharge Instructions: Apply silicone border over primary dressing as directed. Wound #2 - T Great oe Wound Laterality: Dorsal, Right Cleanser: Vashe 5.8 (oz) (Home Health) 3 x Per Week/30 Days Discharge Instructions: Cleanse the wound with Vashe prior to applying a clean dressing using gauze sponges, not tissue or cotton balls. Prim Dressing: MediHoney Gel, tube 1.5 (oz) (Home Health) 3 x Per Week/30 Days ary Discharge Instructions: Apply to wound bed as instructed Prim Dressing: Xeroform Occlusive Gauze Dressing, 4x4 in (Home Health) 3 x Per Week/30 Days ary Discharge Instructions: Apply to wound bed as instructed Secondary Dressing: Bandaid [facility]) 3 x Per Week/30 Days Betty Morales, Betty Morales ([id number]) [id number] Page 7 of 16 Wound #3 - T Second oe Wound Laterality: Dorsal, Right Cleanser: Vashe 5.8 (oz) (Home Health) 3 x Per Week/30 Days Discharge Instructions: Cleanse the wound with Vashe prior to applying a clean dressing using gauze sponges, not tissue or cotton balls. Prim Dressing: MediHoney Gel, tube 1.5 (oz) (Home  Health) 3 x Per Week/30 Days ary Discharge Instructions: Apply to wound bed as instructed Prim Dressing: Xeroform Occlusive Gauze Dressing, 4x4 in (Home Health) 3 x Per Week/30 Days ary Discharge Instructions: Apply to wound bed as instructed Secondary Dressing: Bandaid (Home Health) 3 x Per Week/30 Days Wound #4 - T Third oe Wound Laterality: Dorsal, Right Cleanser: Vashe 5.8 (oz) (Home Health) 3 x Per Week/30 Days Discharge Instructions: Cleanse the wound with Vashe prior to applying a clean dressing using gauze sponges, not tissue or cotton balls. Prim Dressing: MediHoney Gel, tube 1.5 (oz) (Home Health) 3 x Per Week/30 Days ary Discharge Instructions: Apply to wound bed as instructed Prim Dressing: Xeroform Occlusive Gauze Dressing, 4x4 in (Home Health) 3 x Per Week/30 Days ary Discharge Instructions: Apply to wound bed as instructed Secondary Dressing: Bandaid (Home Health) 3 x Per Week/30 Days Wound #5 - Foot Wound Laterality: Right, Medial Cleanser: Vashe 5.8 (oz) 3 x Per Week/30 Days Discharge Instructions: Cleanse the wound with Vashe prior to applying a clean dressing using gauze sponges, not tissue or cotton balls. Prim Dressing: MediHoney Gel, tube 1.5 (oz) 3 x Per Week/30 Days ary Discharge Instructions: Apply to wound bed as instructed Prim Dressing: Xeroform Occlusive Gauze Dressing, 4x4 in 3 x Per Week/30 Days ary Discharge Instructions: Apply to wound bed as instructed Secondary Dressing: Zetuvit Plus Silicone Border Dressing 4x4 (in/in) 3 x Per Week/30 Days Discharge Instructions: Apply silicone border over primary dressing as directed. Wound #6 - T Great oe Wound Laterality: Dorsal, Left Cleanser: Vashe 5.8 (oz) (Home Health) 3 x Per Week/30 Days Discharge  Instructions: Cleanse the wound with Vashe prior to applying a clean dressing using gauze sponges, not tissue or cotton balls. Prim Dressing: MediHoney Gel, tube 1.5 (oz) (Home Health) 3 x Per Week/30  Days ary Discharge Instructions: Apply to wound bed as instructed Prim Dressing: Xeroform Occlusive Gauze Dressing, 4x4 in (Home Health) 3 x Per Week/30 Days ary Discharge Instructions: Apply to wound bed as instructed Secondary Dressing: Bandaid (Home Health) 3 x Per Week/30 Days Wound #7 - T Third oe Wound Laterality: Dorsal, Left Cleanser: Vashe 5.8 (oz) (Home Health) 3 x Per Week/30 Days Discharge Instructions: Cleanse the wound with Vashe prior to applying a clean dressing using gauze sponges, not tissue or cotton balls. Prim Dressing: MediHoney Gel, tube 1.5 (oz) (Home Health) 3 x Per Week/30 Days ary Discharge Instructions: Apply to wound bed as instructed Prim Dressing: Xeroform Occlusive Gauze Dressing, 4x4 in (Home Health) 3 x Per Week/30 Days ary Discharge Instructions: Apply to wound bed as instructed Secondary Dressing: Bandaid (Home Health) 3 x Per Week/30 Days Wound #8 - Calcaneus Wound Laterality: Anterior Cleanser: Vashe 5.8 (oz) 3 x Per Week/30 Days Discharge Instructions: Cleanse the wound with Vashe prior to applying a clean dressing using gauze sponges, not tissue or cotton balls. Prim Dressing: MediHoney Gel, tube 1.5 (oz) 3 x Per Week/30 Days ary Discharge Instructions: Apply to wound bed as instructed Prim Dressing: Xeroform Occlusive Gauze Dressing, 4x4 in 3 x Per Week/30 Days ary Discharge Instructions: Apply to wound bed as instructed Secondary Dressing: Zetuvit Plus Silicone Border Dressing 4x4 (in/in) 3 x Per Week/30 Days Discharge Instructions: Apply silicone border over primary dressing as directed. OLITA, TAKESHITA ([id number]) [id number] Page 8 of 16 Laboratory erobe culture (MICRO) - right foot 2nd toe wound- bone culture - (ICD10 E11.621 - Type 2 Bacteria identified in Unspecified specimen by A diabetes mellitus with foot ulcer) LOINC Code: 634-6 Convenience Name: Aerobic culture-specimen not specified Bacteria  identified in Tissue by Biopsy culture (MICRO) - Biopsy specimen culture- bone biopsy left foot 3rd toe - (ICD10 E11.621 - Type 2 diabetes mellitus with foot ulcer) LOINC Code: 40981-1 Convenience Name: Biopsy specimen culture Bacteria identified in Tissue by Biopsy culture (MICRO) - right foot 2nd toe bone - (ICD10 E11.621 - Type 2 diabetes mellitus with foot ulcer) LOINC Code: 91478-2 Convenience Name: Biopsy specimen culture Patient Medications llergies: penicillin G, etanercept, infliximab, lisinopril, phenobarbital, tramadol A Notifications Medication Indication Start End with any debridements 06/08/2022 lidocaine in clinic. DOSE topical 4 % cream - cream topical once daily 06/08/2022 doxycycline hyclate DOSE 1 - oral 100 mg capsule - 1 capsule oral twice a day x 7 days Electronic Signature(s) Signed: 06/08/2022 6:42:55 PM By: Allen Derry PA-C Signed: 06/09/2022 2:41:21 PM By: Shawn Stall RN, BSN Previous Signature: 06/08/2022 4:21:12 PM Version By: Allen Derry PA-C Entered By: Shawn Stall on 06/08/2022 16:40:10 Prescription 06/08/2022 -------------------------------------------------------------------------------- Betty Lauth PA Patient Name: Provider: 03-08-1940 [id number] Date of Birth: NPI#Carmon Ginsberg [id number] Sex: DEA #: [phone] Phone #: License #: Eligha Bridegroom [facility] Patient Address: [address] [place], [place] [address] [address] [place], [place] [address] [phone] Allergies penicillin G; etanercept; infliximab; lisinopril; phenobarbital; tramadol Provider's Orders Bacteria identified in Tissue by Biopsy culture - ICD10: E11.621 - Biopsy specimen culture- bone biopsy left foot 3rd toe LOINC Code: 20474-3 Convenience Name: Biopsy specimen culture Hand Signature: Date(s): Electronic Signature(s) Signed: 06/08/2022 6:42:55 PM By: Allen Derry PA-C Signed: 06/09/2022 2:41:21  PM By:  Shawn Stall RN, BSN Entered By: Shawn Stall on 06/08/2022 16:40:11 Betty Morales ([id number]) [id number] Page 9 of 16 -------------------------------------------------------------------------------- Problem List Details Patient Name: Date of Service: Betty Morales [facility] RA 06/08/2022 1:30 PM Medical Record Number: [id number] Patient Account Number: [id number] Date of Birth/Sex: Treating RN: [birth year] (82 y.o. F) Primary Care Provider: Tracey Harries Other Clinician: Referring Provider: Treating Provider/Extender: Vaughan Sine in Treatment: 0 Active Problems ICD-10 Encounter Code Description Active Date MDM Diagnosis E11.621 Type 2 diabetes mellitus with foot ulcer 06/08/2022 No Yes L97.312 Non-pressure chronic ulcer of right ankle with fat layer exposed 06/08/2022 No Yes L97.512 Non-pressure chronic ulcer of other part of right foot with fat layer exposed 06/08/2022 No Yes L97.522 Non-pressure chronic ulcer of other part of left foot with fat layer exposed 06/08/2022 No Yes I10 Essential (primary) hypertension 06/08/2022 No Yes G81.93 Hemiplegia, unspecified affecting right nondominant side 06/08/2022 No Yes I48.0 Paroxysmal atrial fibrillation 06/08/2022 No Yes J84.10 Pulmonary fibrosis, unspecified 06/08/2022 No Yes Z79.01 Long term (current) use of anticoagulants 06/08/2022 No Yes I25.10 Atherosclerotic heart disease of native coronary artery without angina pectoris 06/08/2022 No Yes Inactive Problems Resolved Problems Electronic Signature(s) Signed: 06/08/2022 3:13:16 PM By: Allen Derry PA-C Entered By: Allen Derry on 06/08/2022 15:13:16 -------------------------------------------------------------------------------- Progress Note Details Patient Name: Date of Service: Betty Morales, [facility] RBA RA 06/08/2022 1:30 PM Medical Record Number: [id number] Patient Account Number: [id number] Date of Birth/Sex: Treating RN: 24-Jul-1940 (82 y.o. F) Primary  Care Provider: Tracey Harries Other Clinician: Referring Provider: Treating Provider/Extender: Clearence Cheek [place], Britta Mccreedy ([id number]) [id number] Page 10 of 16 Weeks in Treatment: 0 Subjective Chief Complaint Information obtained from Patient Bilateral LE Ulcers History of Present Illness (HPI) 06-08-2022 upon evaluation today patient presents for initial inspection here in our clinic concerning issues that she has been having with wounds over her right lower extremity and bilateral feet. There really is not a cause for what exactly has led to these as she seems to have good arterial flow she has had this checked according to her son when she was in the hospital at [facility]. They told her that everything was fine from a blood flow standpoint. Also we did check her ABIs today and they seem to be doing well at 0.98 bilaterally. Again everything seems to point to having good blood flow. She does however have wounds that are in areas of prominence that seem to be more of a rubbing type situation. Apparently she has had wounds like this in the past and then subsequently they have had to treat them the home health nurse that is and have gotten better. She is in assisted living facility. With that being said unfortunately with this wound things just have not gotten better and has been going on since at least the first of the year her signs not exactly sure when this really began to be honest. He is also not exactly sure where the orders for current wound care measures have come from Betadine and keeping things dry as what is been utilized up to this point. With that being said I do not think this could be appropriate going forward. Patient does have a hemoglobin A1c that was on March 21, 2022 which was 6.1. I also did review the CBC which showed a hemoglobin of 12.2 with hematocrit of 38. On her CMP of note her total protein was 6.1 with albumin of 2.9 and  all the labs except for the A1c  were performed on 01-14-2022. She also other than the diabetes mellitus type 2 has a history of pulmonary fibrosis, atrial fibrillation with long-term use of anticoagulants with Eliquis, coronary artery disease, hemiplegia affecting her right nondominant side and also her speech, and hypertension. Patient History Information obtained from Patient, Chart. Allergies penicillin G (Severity: Severe, Reaction: anaphlaxis), etanercept (Reaction: injection reaction), infliximab (Reaction: felt poorly), lisinopril (Reaction: hypotension), phenobarbital (Reaction: rash), tramadol (Reaction: acted different) Family History Cancer - Siblings, Diabetes - Father, Heart Disease - Father, Hypertension - Father, Stroke - Mother, No family history of Hereditary Spherocytosis, Kidney Disease, Lung Disease, Seizures, Thyroid Problems, Tuberculosis. Social History Never smoker, Marital Status - Widowed, Alcohol Use - Rarely, Drug Use - No History, Caffeine Use - Moderate. Medical History Cardiovascular Patient has history of Hypertension Endocrine Patient has history of Type II Diabetes - 03/21/2022 A1C was 6.1 Patient is treated with Insulin. Blood sugar is tested. Blood sugar results noted at the following times: Breakfast - 100, Lunch - 100, Dinner - 200, Bedtime - 100. Medical A Surgical History Notes nd Hematologic/Lymphatic Lipedema Review of Systems (ROS) Constitutional Symptoms (General Health) Denies complaints or symptoms of Fatigue, Fever, Chills, Marked Weight Change. Eyes Complains or has symptoms of Glasses / Contacts. Ear/Nose/Mouth/Throat Denies complaints or symptoms of Chronic sinus problems or rhinitis. Cardiovascular Mild aortic stenosis Genitourinary Denies complaints or symptoms of Frequent urination. Integumentary (Skin) Complains or has symptoms of Wounds - bilateral foot ulcers. Musculoskeletal Complains or has symptoms of Muscle  Weakness. Neurologic Hemiplegia of right nondominant side. Psychiatric Denies complaints or symptoms of Claustrophobia. Objective Constitutional sitting or standing blood pressure is within target range for patient.. pulse regular and within target range for patient.Marland Kitchen respirations regular, non-labored and within target range for patient.Marland Kitchen temperature within target range for patient.. Well-nourished and well-hydrated in no acute distress. Vitals Time Taken: 1:44 PM, Height: 67 in, Weight: 125 lbs, BMI: 19.6, Temperature: 97.1 F, Pulse: 57 bpm, Respiratory Rate: 18 breaths/min, Blood Pressure: 100/56 mmHg, Capillary Blood Glucose: 100 mg/dl. Betty Morales, Betty Morales ([id number]) [id number] Page 11 of 16 Eyes conjunctiva clear no eyelid edema noted. pupils equal round and reactive to light and accommodation. Ears, Nose, Mouth, and Throat no gross abnormality of ear auricles or external auditory canals. normal hearing noted during conversation. mucus membranes moist. Respiratory normal breathing without difficulty. Cardiovascular 1+ dorsalis pedis/posterior tibialis pulses. no clubbing, cyanosis, significant edema, Musculoskeletal Patient unable to walk without assistance. Psychiatric this patient is able to make decisions to a degree but does have some limitation in expressing herself secondary to the stroke. Alert and Oriented x 3. pleasant and cooperative. General Notes: Upon inspection patient's wound bed actually showed signs at all locations on the toes of actually having bone exposed unfortunately. I did not see any bone exposed on her foot wounds nor on the leg wound at this point which is good news. Nonetheless it really seems like all of the areas of open wounds or areas of bony prominence which unfortunately has led to breakdown and with this bone exposure some of the bone is actually very loose and I think is going to be easily sampled for pathology and  culture. I think that something that we should do in order to make sure that we are treating her appropriately for what is going on here. I think that she has osteomyelitis based on what I am seeing currently. These are all toes great to ulcerations possibly grade 3 depending on the results of the pathology and culture. Integumentary (  Hair, Skin) Wound #1 status is Open. Original cause of wound was Blister. The date acquired was: 02/21/2022. The wound is located on the Right,Lateral Ankle. The wound measures 2.2cm length x 1.7cm width x 0.3cm depth; 2.937cm^2 area and 0.881cm^3 volume. There is Fat Layer (Subcutaneous Tissue) exposed. There is no tunneling or undermining noted. There is a medium amount of serosanguineous drainage noted. The wound margin is distinct with the outline attached to the wound base. There is medium (34-66%) red, pink granulation within the wound bed. There is a medium (34-66%) amount of necrotic tissue within the wound bed including Adherent Slough. The periwound skin appearance had no abnormalities noted for texture. The periwound skin appearance exhibited: Hemosiderin Staining. The periwound skin appearance did not exhibit: Dry/Scaly, Maceration. Wound #2 status is Open. Original cause of wound was Blister. The date acquired was: 02/21/2022. The wound is located on the Right,Dorsal T [place]. The oe wound measures 2cm length x 1.1cm width x 0.2cm depth; 1.728cm^2 area and 0.346cm^3 volume. There is bone and Fat Layer (Subcutaneous Tissue) exposed. There is no tunneling or undermining noted. There is a medium amount of serosanguineous drainage noted. The wound margin is distinct with the outline attached to the wound base. There is no granulation within the wound bed. There is a large (67-100%) amount of necrotic tissue within the wound bed including Eschar. The periwound skin appearance had no abnormalities noted for texture. The periwound skin appearance exhibited: Hemosiderin  Staining. The periwound skin appearance did not exhibit: Dry/Scaly, Maceration, Atrophie Blanche, Cyanosis, Ecchymosis, Mottled, Pallor, Rubor, Erythema. Periwound temperature was noted as No Abnormality. Wound #3 status is Open. Original cause of wound was Blister. The date acquired was: 02/21/2022. The wound is located on the Right,Dorsal T Second. The oe wound measures 1.3cm length x 1.2cm width x 0.1cm depth; 1.225cm^2 area and 0.123cm^3 volume. There is bone and Fat Layer (Subcutaneous Tissue) exposed. There is no tunneling or undermining noted. There is a medium amount of serosanguineous drainage noted. The wound margin is distinct with the outline attached to the wound base. There is no granulation within the wound bed. There is a large (67-100%) amount of necrotic tissue within the wound bed including Eschar. The periwound skin appearance had no abnormalities noted for texture. The periwound skin appearance did not exhibit: Dry/Scaly, Maceration, Atrophie Blanche, Cyanosis, Ecchymosis, Hemosiderin Staining, Mottled, Pallor, Rubor, Erythema. Periwound temperature was noted as No Abnormality. Wound #4 status is Open. Original cause of wound was Blister. The date acquired was: 02/21/2022. The wound is located on the Right,Dorsal T Third. The wound oe measures 0.9cm length x 1.2cm width x 0.1cm depth; 0.848cm^2 area and 0.085cm^3 volume. There is bone and Fat Layer (Subcutaneous Tissue) exposed. There is no tunneling or undermining noted. There is a medium amount of serosanguineous drainage noted. The wound margin is distinct with the outline attached to the wound base. There is no granulation within the wound bed. There is a large (67-100%) amount of necrotic tissue within the wound bed including Eschar. The periwound skin appearance had no abnormalities noted for texture. The periwound skin appearance did not exhibit: Dry/Scaly, Maceration, Atrophie Blanche, Cyanosis, Ecchymosis, Hemosiderin  Staining, Mottled, Pallor, Rubor, Erythema. Wound #5 status is Open. Original cause of wound was Gradually Appeared. The date acquired was: 02/21/2022. The wound is located on the Right,Medial Foot. The wound measures 0.3cm length x 0.3cm width x 0.1cm depth; 0.071cm^2 area and 0.007cm^3 volume. There is Fat Layer (Subcutaneous Tissue) exposed. There is no tunneling  or undermining noted. There is a medium amount of serosanguineous drainage noted. The wound margin is distinct with the outline attached to the wound base. There is no granulation within the wound bed. There is a large (67-100%) amount of necrotic tissue within the wound bed including Eschar. The periwound skin appearance had no abnormalities noted for texture. The periwound skin appearance did not exhibit: Dry/Scaly, Maceration, Atrophie Blanche, Cyanosis, Ecchymosis, Hemosiderin Staining, Mottled, Pallor, Rubor, Erythema. Periwound temperature was noted as No Abnormality. Wound #6 status is Open. Original cause of wound was Blister. The date acquired was: 02/21/2022. The wound is located on the Left,Dorsal T Great. The wound oe measures 1.5cm length x 1.3cm width x 0.2cm depth; 1.532cm^2 area and 0.306cm^3 volume. There is bone and Fat Layer (Subcutaneous Tissue) exposed. There is no tunneling or undermining noted. There is a medium amount of serosanguineous drainage noted. The wound margin is distinct with the outline attached to the wound base. There is medium (34-66%) red granulation within the wound bed. There is a medium (34-66%) amount of necrotic tissue within the wound bed including Eschar. The periwound skin appearance had no abnormalities noted for texture. The periwound skin appearance did not exhibit: Dry/Scaly, Maceration, Atrophie Blanche, Cyanosis, Ecchymosis, Hemosiderin Staining, Mottled, Pallor, Rubor, Erythema. Periwound temperature was noted as No Abnormality. Wound #7 status is Open. Original cause of wound was Blister.  The date acquired was: 02/21/2022. The wound is located on the Left,Dorsal T Third. The wound oe measures 1.6cm length x 1.1cm width x 0.1cm depth; 1.382cm^2 area and 0.138cm^3 volume. There is bone and Fat Layer (Subcutaneous Tissue) exposed. There is no tunneling or undermining noted. There is a medium amount of serosanguineous drainage noted. The wound margin is flat and intact. There is medium (34-66%) red granulation within the wound bed. There is a medium (34-66%) amount of necrotic tissue within the wound bed including Eschar. The periwound skin appearance had no abnormalities noted for texture. The periwound skin appearance did not exhibit: Dry/Scaly, Maceration, Atrophie Blanche, Cyanosis, Ecchymosis, Hemosiderin Staining, Mottled, Pallor, Rubor, Erythema. Wound #8 status is Open. Original cause of wound was Blister. The date acquired was: 02/21/2022. The wound is located on the Anterior Calcaneus. The wound measures 0.9cm length x 0.7cm width x 0.1cm depth; 0.495cm^2 area and 0.049cm^3 volume. There is Fat Layer (Subcutaneous Tissue) exposed. There is no tunneling or undermining noted. There is a medium amount of serosanguineous drainage noted. The wound margin is flat and intact. There is medium (34-66%) red granulation within the wound bed. There is a medium (34-66%) amount of necrotic tissue within the wound bed including Eschar. The periwound skin appearance had no abnormalities noted for texture. The periwound skin appearance did not exhibit: Dry/Scaly, Maceration, Atrophie Blanche, Cyanosis, Ecchymosis, Hemosiderin Staining, Mottled, Pallor, Rubor, Erythema. Periwound temperature was noted as No Abnormality. Assessment Betty Morales, Betty Morales ([id number]) [id number] Page 12 of 16 Active Problems ICD-10 Type 2 diabetes mellitus with foot ulcer Non-pressure chronic ulcer of right ankle with fat layer exposed Non-pressure chronic ulcer of other part of right foot with  fat layer exposed Non-pressure chronic ulcer of other part of left foot with fat layer exposed Essential (primary) hypertension Hemiplegia, unspecified affecting right nondominant side Paroxysmal atrial fibrillation Pulmonary fibrosis, unspecified Long term (current) use of anticoagulants Atherosclerotic heart disease of native coronary artery without angina pectoris Procedures Wound #2 Pre-procedure diagnosis of Wound #2 is a Diabetic Wound/Ulcer of the Lower Extremity located on the Right,Dorsal T Great .Severity of Tissue Pre oe Debridement  is: Bone involvement without necrosis. There was a Excisional Skin/Subcutaneous Tissue Debridement with a total area of 2.2 sq cm performed by Lenda Kelp, PA. With the following instrument(s): Curette to remove Viable and Non-Viable tissue/material. Material removed includes Eschar, Subcutaneous Tissue, and Slough after achieving pain control using Lidocaine 4% T opical Solution. No specimens were taken. A time out was conducted at 15:32, prior to the start of the procedure. A Minimum amount of bleeding was controlled with Pressure. The procedure was tolerated well with a pain level of 0 throughout and a pain level of 0 following the procedure. Post Debridement Measurements: 2cm length x 1.1cm width x 0.2cm depth; 0.346cm^3 volume. Character of Wound/Ulcer Post Debridement is stable. Severity of Tissue Post Debridement is: Bone involvement without necrosis. Post procedure Diagnosis Wound #2: Same as Pre-Procedure General Notes: Scribed for Dr. Leonard Schwartz by Brenton Grills RN.Marland Kitchen Wound #3 Pre-procedure diagnosis of Wound #3 is a Diabetic Wound/Ulcer of the Lower Extremity located on the Right,Dorsal T Second .Severity of Tissue Pre oe Debridement is: Bone involvement without necrosis. There was a Excisional Skin/Subcutaneous Tissue/Muscle/Bone Debridement with a total area of 1.56 sq cm performed by Lenda Kelp, PA. With the following instrument(s):  Curette to remove Viable and Non-Viable tissue/material. Material removed includes Bone,Eschar, Subcutaneous Tissue, and Slough after achieving pain control using Lidocaine 4% T opical Solution. 2 specimens were taken by a Swab and sent to the lab per facility protocol. A time out was conducted at 15:32, prior to the start of the procedure. A Minimum amount of bleeding was controlled with Pressure. The procedure was tolerated well with a pain level of 0 throughout and a pain level of 0 following the procedure. Post Debridement Measurements: 1.3cm length x 1.2cm width x 0.1cm depth; 0.123cm^3 volume. Character of Wound/Ulcer Post Debridement is stable. Severity of Tissue Post Debridement is: Bone involvement without necrosis. Post procedure Diagnosis Wound #3: Same as Pre-Procedure General Notes: Scribed for Dr. Leonard Schwartz by Brenton Grills RN. Bone culture and biopsy taken.. Wound #4 Pre-procedure diagnosis of Wound #4 is a Diabetic Wound/Ulcer of the Lower Extremity located on the Right,Dorsal T Third .Severity of Tissue Pre oe Debridement is: Bone involvement without necrosis. There was a Excisional Skin/Subcutaneous Tissue Debridement with a total area of 1.08 sq cm performed by Lenda Kelp, PA. With the following instrument(s): Curette to remove Viable and Non-Viable tissue/material. Material removed includes Eschar, Subcutaneous Tissue, and Slough after achieving pain control using Lidocaine 4% T opical Solution. No specimens were taken. A time out was conducted at 15:32, prior to the start of the procedure. A Minimum amount of bleeding was controlled with Pressure. The procedure was tolerated well with a pain level of 0 throughout and a pain level of 0 following the procedure. Post Debridement Measurements: 0.9cm length x 1.2cm width x 0.1cm depth; 0.085cm^3 volume. Character of Wound/Ulcer Post Debridement is stable. Severity of Tissue Post Debridement is: Bone involvement without necrosis. Post  procedure Diagnosis Wound #4: Same as Pre-Procedure General Notes: Scribed for Dr. Leonard Schwartz by Brenton Grills RN.Marland Kitchen Wound #6 Pre-procedure diagnosis of Wound #6 is a Diabetic Wound/Ulcer of the Lower Extremity located on the Left,Dorsal T Great .Severity of Tissue Pre oe Debridement is: Bone involvement without necrosis. There was a Excisional Skin/Subcutaneous Tissue/Muscle/Bone Debridement with a total area of 1.95 sq cm performed by Lenda Kelp, PA. With the following instrument(s): Curette to remove Viable and Non-Viable tissue/material. Material removed includes Bone,Eschar, Subcutaneous Tissue, and Slough after achieving pain  control using Lidocaine 4% T opical Solution. 1 specimen was taken by a Swab and sent to the lab per facility protocol. A time out was conducted at 15:32, prior to the start of the procedure. A Minimum amount of bleeding was controlled with Pressure. The procedure was tolerated well with a pain level of 0 throughout and a pain level of 0 following the procedure. Post Debridement Measurements: 1.5cm length x 1.3cm width x 0.1cm depth; 0.153cm^3 volume. Character of Wound/Ulcer Post Debridement is stable. Severity of Tissue Post Debridement is: Bone involvement without necrosis. Post procedure Diagnosis Wound #6: Same as Pre-Procedure General Notes: Scribed for Dr. Leonard Schwartz by Brenton Grills RN. Bone Biopsy taken.. Wound #7 Pre-procedure diagnosis of Wound #7 is a Diabetic Wound/Ulcer of the Lower Extremity located on the Left,Dorsal T Third .Severity of Tissue Pre oe Debridement is: Bone involvement without necrosis. There was a Excisional Skin/Subcutaneous Tissue Debridement with a total area of 1.76 sq cm performed by Lenda Kelp, PA. With the following instrument(s): Curette Material removed includes Eschar, Subcutaneous Tissue, and Slough after achieving pain control using Lidocaine 4% Topical Solution. No specimens were taken. A time out was conducted at 15:32, prior  to the start of the procedure. A Minimum amount of bleeding was controlled with Pressure. The procedure was tolerated well. Post Debridement Measurements: 1.6cm length x 1.1cm width x 0.1cm depth; 0.138cm^3 volume. Character of Wound/Ulcer Post Debridement is stable. Severity of Tissue Post Debridement is: Bone involvement without necrosis. Post procedure Diagnosis Wound #7: Same as Pre-Procedure General Notes: Scribed for Dr. Leonard Schwartz by Brenton Grills RN.Marland Kitchen Plan Follow-up Appointments: Return Appointment in 1 week. Leonard Schwartz [facility] Wednesday 1230 06/15/2022 room 7 extra time 60 minutes Other: - culture and biopsy- once results are back will call you. Anesthetic: (In clinic) Topical Lidocaine 4% applied to wound bed [place], Britta Mccreedy ([id number]) [phone].pdf Page 13 of 16 Bathing/ Shower/ Hygiene: May shower with protection but do not get wound dressing(s) wet. Protect dressing(s) with water repellant cover (for example, large plastic bag) or a cast cover and may then take shower. Off-Loading: Open toe surgical shoe to: - Bilateral feet Additional Orders / Instructions: Follow Nutritious Diet - increase protein Home Health: New wound care orders this week; continue Home Health for wound care. May utilize formulary equivalent dressing for wound treatment orders unless otherwise specified. - [facility] please see patient twice a week. wound center weekly. Other Home Health Orders/Instructions: - [facility] Laboratory ordered were: Aerobic culture-specimen not specified - right foot 2nd toe wound- bone culture, Biopsy specimen culture - Biopsy specimen culture- bone biopsy left foot 3rd toe, Biopsy specimen culture- right foot 2nd toe bone - right foot 2nd toe bone The following medication(s) was prescribed: lidocaine topical 4 % cream cream topical once daily for with any debridements in clinic. was prescribed at  facility doxycycline hyclate oral 100 mg capsule 1 1 capsule oral twice a day x 7 days starting 06/08/2022 WOUND #1: - Ankle Wound Laterality: Right, Lateral Cleanser: Vashe 5.8 (oz) (Home Health) 3 x Per Week/30 Days Discharge Instructions: Cleanse the wound with Vashe prior to applying a clean dressing using gauze sponges, not tissue or cotton balls. Prim Dressing: MediHoney Gel, tube 1.5 (oz) (Home Health) 3 x Per Week/30 Days ary Discharge Instructions: Apply to wound bed as instructed Prim Dressing: Xeroform Occlusive Gauze Dressing, 4x4 in (Home Health) 3 x Per Week/30 Days ary Discharge Instructions: Apply to wound bed as instructed Secondary Dressing:  Zetuvit Plus Silicone Border Dressing 4x4 (in/in) (Home Health) 3 x Per Week/30 Days Discharge Instructions: Apply silicone border over primary dressing as directed. WOUND #2: - T Great Wound Laterality: Dorsal, Right oe Cleanser: Vashe 5.8 (oz) (Home Health) 3 x Per Week/30 Days Discharge Instructions: Cleanse the wound with Vashe prior to applying a clean dressing using gauze sponges, not tissue or cotton balls. Prim Dressing: MediHoney Gel, tube 1.5 (oz) (Home Health) 3 x Per Week/30 Days ary Discharge Instructions: Apply to wound bed as instructed Prim Dressing: Xeroform Occlusive Gauze Dressing, 4x4 in (Home Health) 3 x Per Week/30 Days ary Discharge Instructions: Apply to wound bed as instructed Secondary Dressing: Bandaid (Home Health) 3 x Per Week/30 Days WOUND #3: - T Second Wound Laterality: Dorsal, Right oe Cleanser: Vashe 5.8 (oz) (Home Health) 3 x Per Week/30 Days Discharge Instructions: Cleanse the wound with Vashe prior to applying a clean dressing using gauze sponges, not tissue or cotton balls. Prim Dressing: MediHoney Gel, tube 1.5 (oz) (Home Health) 3 x Per Week/30 Days ary Discharge Instructions: Apply to wound bed as instructed Prim Dressing: Xeroform Occlusive Gauze Dressing, 4x4 in (Home Health) 3 x Per  Week/30 Days ary Discharge Instructions: Apply to wound bed as instructed Secondary Dressing: Bandaid (Home Health) 3 x Per Week/30 Days WOUND #4: - T Third Wound Laterality: Dorsal, Right oe Cleanser: Vashe 5.8 (oz) (Home Health) 3 x Per Week/30 Days Discharge Instructions: Cleanse the wound with Vashe prior to applying a clean dressing using gauze sponges, not tissue or cotton balls. Prim Dressing: MediHoney Gel, tube 1.5 (oz) (Home Health) 3 x Per Week/30 Days ary Discharge Instructions: Apply to wound bed as instructed Prim Dressing: Xeroform Occlusive Gauze Dressing, 4x4 in (Home Health) 3 x Per Week/30 Days ary Discharge Instructions: Apply to wound bed as instructed Secondary Dressing: Bandaid (Home Health) 3 x Per Week/30 Days WOUND #5: - Foot Wound Laterality: Right, Medial Cleanser: Vashe 5.8 (oz) 3 x Per Week/30 Days Discharge Instructions: Cleanse the wound with Vashe prior to applying a clean dressing using gauze sponges, not tissue or cotton balls. Prim Dressing: MediHoney Gel, tube 1.5 (oz) 3 x Per Week/30 Days ary Discharge Instructions: Apply to wound bed as instructed Prim Dressing: Xeroform Occlusive Gauze Dressing, 4x4 in 3 x Per Week/30 Days ary Discharge Instructions: Apply to wound bed as instructed Secondary Dressing: Zetuvit Plus Silicone Border Dressing 4x4 (in/in) 3 x Per Week/30 Days Discharge Instructions: Apply silicone border over primary dressing as directed. WOUND #6: - T Great Wound Laterality: Dorsal, Left oe Cleanser: Vashe 5.8 (oz) (Home Health) 3 x Per Week/30 Days Discharge Instructions: Cleanse the wound with Vashe prior to applying a clean dressing using gauze sponges, not tissue or cotton balls. Prim Dressing: MediHoney Gel, tube 1.5 (oz) (Home Health) 3 x Per Week/30 Days ary Discharge Instructions: Apply to wound bed as instructed Prim Dressing: Xeroform Occlusive Gauze Dressing, 4x4 in (Home Health) 3 x Per Week/30 Days ary Discharge  Instructions: Apply to wound bed as instructed Secondary Dressing: Bandaid (Home Health) 3 x Per Week/30 Days WOUND #7: - T Third Wound Laterality: Dorsal, Left oe Cleanser: Vashe 5.8 (oz) (Home Health) 3 x Per Week/30 Days Discharge Instructions: Cleanse the wound with Vashe prior to applying a clean dressing using gauze sponges, not tissue or cotton balls. Prim Dressing: MediHoney Gel, tube 1.5 (oz) (Home Health) 3 x Per Week/30 Days ary Discharge Instructions: Apply to wound bed as instructed Prim Dressing: Xeroform Occlusive Gauze Dressing, 4x4 in (  Home Health) 3 x Per Week/30 Days ary Discharge Instructions: Apply to wound bed as instructed Secondary Dressing: Bandaid (Home Health) 3 x Per Week/30 Days WOUND #8: - Calcaneus Wound Laterality: Anterior Cleanser: Vashe 5.8 (oz) 3 x Per Week/30 Days Discharge Instructions: Cleanse the wound with Vashe prior to applying a clean dressing using gauze sponges, not tissue or cotton balls. Prim Dressing: MediHoney Gel, tube 1.5 (oz) 3 x Per Week/30 Days ary Discharge Instructions: Apply to wound bed as instructed Prim Dressing: Xeroform Occlusive Gauze Dressing, 4x4 in 3 x Per Week/30 Days ary Discharge Instructions: Apply to wound bed as instructed Secondary Dressing: Zetuvit Plus Silicone Border Dressing 4x4 (in/in) 3 x Per Week/30 Days Discharge Instructions: Apply silicone border over primary dressing as directed. 1. Based on what I am seeing I do believe that the patient is currently going to require some further intervention. I did obtain bone biopsies from the right second toe and the left third toe which are to be sent for pathology. Also obtained a bone culture from the right second toe which I am also going to send for organism identification. 2. I am also going to suggest at this point that we use Medihoney followed by Xeroform to the wounds to try to help keep this from drying out. We may switch to just doing the Xeroform but there  is seems to be a very small amount of Medihoney use at this point. Betty Morales, Betty Morales ([id number]) [id number] Page 14 of 16 3. I am also going to suggest the patient should use a bordered foam dressing on the ankle and foot locations I think that will work well after applying the Medihoney and Xeroform. 4. I am going to go ahead and send in a prescription for the patient. This was actually for doxycycline and this is just can be for 7 days initially. I did culture depend on the results of the culture we will see where things stand at that point and what we need to do as far as continuing doxycycline and possibly doing 1 or a different medication altogether. We will see patient back for reevaluation in 1 week here in the clinic. If anything worsens or changes patient will contact our office for additional recommendations. We will see patient back for reevaluation in 1 week here in the clinic. If anything worsens or changes patient will contact our office for additional recommendations. Electronic Signature(s) Signed: 06/08/2022 6:41:46 PM By: Allen Derry PA-C Entered By: Allen Derry on 06/08/2022 18:41:46 -------------------------------------------------------------------------------- HxROS Details Patient Name: Date of Service: Betty Morales, [place] RBA RA 06/08/2022 1:30 PM Medical Record Number: [id number] Patient Account Number: [id number] Date of Birth/Sex: Treating RN: [birth year] (82 y.o. Betty Morales Primary Care Provider: Tracey Harries Other Clinician: Referring Provider: Treating Provider/Extender: Vaughan Sine in Treatment: 0 Information Obtained From Patient Chart Constitutional Symptoms (General Health) Complaints and Symptoms: Negative for: Fatigue; Fever; Chills; Marked Weight Change Eyes Complaints and Symptoms: Positive for: Glasses / Contacts Ear/Nose/Mouth/Throat Complaints and Symptoms: Negative for: Chronic sinus problems or  rhinitis Genitourinary Complaints and Symptoms: Negative for: Frequent urination Integumentary (Skin) Complaints and Symptoms: Positive for: Wounds - bilateral foot ulcers Musculoskeletal Complaints and Symptoms: Positive for: Muscle Weakness Psychiatric Complaints and Symptoms: Negative for: Claustrophobia Hematologic/Lymphatic Medical History: Past Medical History Notes: Lipedema Cardiovascular Complaints and Symptoms: Review of System Notes: Mild aortic stenosis Betty Morales ([id number]) [id number] Page 15 of 16 Medical History: Positive for: Hypertension Endocrine Medical History: Positive for: Type II Diabetes -  03/21/2022 A1C was 6.1 Time with diabetes: 20 years Treated with: Insulin Blood sugar tested every day: Yes T ested : Blood sugar testing results: Breakfast: 100; Lunch: 100; Dinner: 200; Bedtime: 100 Immunological Neurologic Complaints and Symptoms: Review of System Notes: Hemiplegia of right nondominant side. Oncologic Immunizations Pneumococcal Vaccine: Received Pneumococcal Vaccination: Yes Received Pneumococcal Vaccination On or After 60th Birthday: Yes Tetanus Vaccine: Last tetanus shot: 07/29/2009 Implantable Devices No devices added Family and Social History Cancer: Yes - Siblings; Diabetes: Yes - Father; Heart Disease: Yes - Father; Hereditary Spherocytosis: No; Hypertension: Yes - Father; Kidney Disease: No; Lung Disease: No; Seizures: No; Stroke: Yes - Mother; Thyroid Problems: No; Tuberculosis: No; Never smoker; Marital Status - Widowed; Alcohol Use: Rarely; Drug Use: No History; Caffeine Use: Moderate Electronic Signature(s) Signed: 06/08/2022 6:42:55 PM By: Allen Derry PA-C Signed: 06/09/2022 1:56:54 PM By: Brenton Grills Entered By: Brenton Grills on 06/08/2022 16:42:59 -------------------------------------------------------------------------------- SuperBill Details Patient Name: Date of Service: Betty Morales,  [place] RBA RA 06/08/2022 Medical Record Number: [id number] Patient Account Number: [id number] Date of Birth/Sex: Treating RN: 10-21-1940 (82 y.o. 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 I10 Essential (primary) hypertension G81.93 Hemiplegia, unspecified affecting right nondominant side I48.0 Paroxysmal atrial fibrillation J84.10 Pulmonary fibrosis, unspecified Z79.01 Long term (current) use of anticoagulants I25.10 Atherosclerotic heart disease of native coronary artery without angina pectoris Betty Morales ([id number]) [id number] Page 16 of 16 Facility Procedures : CPT4 Code: 21308657 Description: 99214 - WOUND CARE VISIT-LEV 4 EST PT Modifier: Quantity: 1 : CPT4 Code: 84696295 Description: 11042 - DEB SUBQ TISSUE 20 SQ CM/< ICD-10 Diagnosis Description 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 Modifier: Quantity: 1 : CPT4 Code: [id number] Description: 11044 - DEB BONE 20 SQ CM/< ICD-10 Diagnosis Description 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 Modifier: Quantity: 1 Physician Procedures : CPT4: Description Modifier Code [id number] 99204 - WC PHYS LEVEL 4 - NEW PT 25 ICD-10 Diagnosis Description E11.621 Type 2 diabetes mellitus with foot  ulcer L97.312 Non-pressure chronic ulcer of right ankle with fat layer exposed L97.512 Non-pressure  chronic ulcer of other part of right foot with fat layer exposed L97.522 Non-pressure chronic ulcer of other part of left foot with fat layer exposed Quantity: 1 : CPT4: [id number] 11042 - WC PHYS SUBQ TISS 20 SQ CM ICD-10 Diagnosis Description 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

## 2022-06-14 LAB — AEROBIC/ANAEROBIC CULTURE W GRAM STAIN (SURGICAL/DEEP WOUND): Gram Stain: NONE SEEN

## 2022-06-15 ENCOUNTER — Encounter (HOSPITAL_BASED_OUTPATIENT_CLINIC_OR_DEPARTMENT_OTHER): Payer: Medicare (Managed Care) | Admitting: Physician Assistant

## 2022-06-15 DIAGNOSIS — E11621 Type 2 diabetes mellitus with foot ulcer: Secondary | ICD-10-CM | POA: Diagnosis not present

## 2022-06-16 NOTE — Progress Notes (Signed)
ZAYLIN, PISTILLI ([id number]) [id number] Page 1 of 17 Visit Report for 06/15/2022 Arrival Information Details Patient Name: Date of Service: Betty Morales [facility] RA 06/15/2022 12:30 PM Medical Record Number: [id number] Patient Account Number: [id number] Date of Birth/Sex: Treating RN: 05/12/1940 (82 y.o. Katrinka Blazing Primary Care Sharonlee Nine: Tracey Harries Other Clinician: Referring Nysir Fergusson: Treating Mallissa Lorenzen/Extender: Vaughan Sine in Treatment: 1 Visit Information History Since Last Visit Added or deleted any medications: No Patient Arrived: Wheel Chair Any new allergies or adverse reactions: No Arrival Time: 12:58 Had a fall or experienced change in Yes Accompanied By: son activities of daily living that may affect Transfer Assistance: Manual risk of falls: Patient Identification Verified: Yes Signs or symptoms of abuse/neglect since last visito No Secondary Verification Process Completed: Yes Hospitalized since last visit: No Patient Requires Transmission-Based Precautions: No Implantable device outside of the clinic excluding No Patient Has Alerts: Yes cellular tissue based products placed in the center Patient Alerts: Patient on Blood Thinner since last visit: Patient on Eliquis Has Dressing in Place as Prescribed: Yes Pain Present Now: Yes Electronic Signature(s) Signed: 06/15/2022 5:08:22 PM By: Karie Schwalbe RN Entered By: Karie Schwalbe on 06/15/2022 12:59:25 -------------------------------------------------------------------------------- Clinic Level of Care Assessment Details Patient Name: Date of Service: Betty Morales [facility] RA 06/15/2022 12:30 PM Medical Record Number: [id number] Patient Account Number: [id number] Date of Birth/Sex: Treating RN: September 28, 1940 (82 y.o. Katrinka Blazing Primary Care Randie Bloodgood: Tracey Harries Other Clinician: Referring Emilygrace Grothe: Treating Quiera Diffee/Extender: Vaughan Sine in  Treatment: 1 Clinic Level of Care Assessment Items TOOL 4 Quantity Score X- 1 0 Use when only an EandM is performed on FOLLOW-UP visit ASSESSMENTS - Nursing Assessment / Reassessment X- 1 10 Reassessment of Co-morbidities (includes updates in patient status) X- 1 5 Reassessment of Adherence to Treatment Plan ASSESSMENTS - Wound and Skin A ssessment / Reassessment []  - 0 Simple Wound Assessment / Reassessment - one wound X- 9 5 Complex Wound Assessment / Reassessment - multiple wounds []  - 0 Dermatologic / Skin Assessment (not related to wound area) ASSESSMENTS - Focused Assessment X- 2 5 Circumferential Edema Measurements - multi extremities []  - 0 Nutritional Assessment / Counseling / Intervention []  - 0 Lower Extremity Assessment (monofilament, tuning fork, pulses) []  - 0 Peripheral Arterial Disease Assessment (using hand held doppler) ASSESSMENTS - Ostomy and/or Continence Assessment and Care []  - 0 Incontinence Assessment and Management []  - 0 Ostomy Care Assessment and Management (repouching, etc.) PROCESS - Coordination of Care []  - 0 Simple Patient / Family Education for ongoing care Betty Morales, Betty Morales ([id number]) [id number] Page 2 of 17 X- 1 20 Complex (extensive) Patient / Family Education for ongoing care X- 1 10 Staff obtains Consents, Records, T Results / Process Orders est X- 1 10 Staff telephones HHA, Nursing Homes / Clarify orders / etc []  - 0 Routine Transfer to another Facility (non-emergent condition) []  - 0 Routine Hospital Admission (non-emergent condition) []  - 0 New Admissions / Manufacturing engineer / Ordering NPWT Apligraf, etc. , []  - 0 Emergency Hospital Admission (emergent condition) []  - 0 Simple Discharge Coordination X- 1 15 Complex (extensive) Discharge Coordination PROCESS - Special Needs []  - 0 Pediatric / Minor Patient Management []  - 0 Isolation Patient Management []  - 0 Hearing / Language /  Visual special needs []  - 0 Assessment of Community assistance (transportation, D/C planning, etc.) []  - 0 Additional assistance / Altered mentation []  - 0 Support Surface(s) Assessment (bed, cushion, seat, etc.) INTERVENTIONS -  Wound Cleansing / Measurement  - 0 Simple Wound Cleansing - one wound X- 9 5 Complex Wound Cleansing - multiple wounds X- 1 5 Wound Imaging (photographs - any number of wounds)  - 0 Wound Tracing (instead of photographs)  - 0 Simple Wound Measurement - one wound X- 9 5 Complex Wound Measurement - multiple wounds INTERVENTIONS - Wound Dressings  - 0 Small Wound Dressing one or multiple wounds X- 9 15 Medium Wound Dressing one or multiple wounds  - 0 Large Wound Dressing one or multiple wounds  - 0 Application of Medications - topical  - 0 Application of Medications - injection INTERVENTIONS - Miscellaneous  - 0 External ear exam  - 0 Specimen Collection (cultures, biopsies, blood, body fluids, etc.)  - 0 Specimen(s) / Culture(s) sent or taken to Lab for analysis  - 0 Patient Transfer (multiple staff / Nurse, adult / Similar devices)  - 0 Simple Staple / Suture removal (25 or less)  - 0 Complex Staple / Suture removal (26 or more)  - 0 Hypo / Hyperglycemic Management (close monitor of Blood Glucose)  - 0 Ankle / Brachial Index (ABI) - do not check if billed separately X- 1 5 Vital Signs Has the patient been seen at the hospital within the last three years: Yes Total Score: 360 Level Of Care: New/Established - Level 5 Electronic Signature(s) Signed: 06/15/2022 5:08:22 PM By: Karie Schwalbe RN Entered By: Karie Schwalbe on 06/15/2022 17:04:44 Betty Morales ([id number]) [phone].pdf Page 3 of 17 -------------------------------------------------------------------------------- Encounter Discharge Information Details Patient Name: Date of Service: Betty Morales [facility] RA 06/15/2022 12:30  PM Medical Record Number: [id number] Patient Account Number: [id number] Date of Birth/Sex: Treating RN: [birth year] (82 y.o. Katrinka Blazing Primary Care Jessamine Barcia: Tracey Harries Other Clinician: Referring Kanijah Groseclose: Treating Jennipher Weatherholtz/Extender: Vaughan Sine in Treatment: 1 Encounter Discharge Information Items Discharge Condition: Stable Ambulatory Status: Wheelchair Discharge Destination: Home Transportation: Private Auto Accompanied By: son Schedule Follow-up Appointment: Yes Clinical Summary of Care: Patient Declined Electronic Signature(s) Signed: 06/15/2022 5:08:22 PM By: Karie Schwalbe RN Entered By: Karie Schwalbe on 06/15/2022 17:05:53 -------------------------------------------------------------------------------- Lower Extremity Assessment Details Patient Name: Date of Service: Betty Morales [facility] RA 06/15/2022 12:30 PM Medical Record Number: [id number] Patient Account Number: [id number] Date of Birth/Sex: Treating RN: 1940/06/06 (82 y.o. Katrinka Blazing Primary Care Roxy Mastandrea: Tracey Harries Other Clinician: Referring Rykker Coviello: Treating Shavonte Zhao/Extender: Vaughan Sine in Treatment: 1 Edema Assessment Assessed: [Left: No] [Right: No] Edema: [Left: No] [Right: No] Calf Left: Right: Point of Measurement: From Medial Instep 29 cm 29.3 cm Ankle Left: Right: Point of Measurement: From Medial Instep 20.5 cm 19.9 cm Electronic Signature(s) Signed: 06/15/2022 5:08:22 PM By: Karie Schwalbe RN Entered By: Karie Schwalbe on 06/15/2022 13:10:37 -------------------------------------------------------------------------------- Multi-Disciplinary Care Plan Details Patient Name: Date of Service: Betty Morales, [facility] RBA RA 06/15/2022 12:30 PM Medical Record Number: [id number] Patient Account Number: [id number] Date of Birth/Sex: Treating RN: 04-Jan-1941 (82 y.o. Katrinka Blazing Primary Care Deno Sida: Tracey Harries Other Clinician: Referring  Vada Swift: Treating Jacobus Colvin/Extender: Vaughan Sine in Treatment: 1 Active Inactive Wound/Skin Impairment [place], Betty Morales ([id number]) [id number] Page 4 of 17 Nursing Diagnoses: Impaired tissue integrity Goals: Patient/caregiver will verbalize understanding of skin care regimen Date Initiated: 06/08/2022 Target Resolution Date: 11/30/2022 Goal Status: Active Interventions: Assess patient/caregiver ability to obtain necessary supplies Assess patient/caregiver ability to perform ulcer/skin care regimen upon admission and as needed Assess ulceration(s) every visit Provide education on ulcer and skin care  Treatment Activities: Skin care regimen initiated : 06/08/2022 Topical wound management initiated : 06/08/2022 Notes: Electronic Signature(s) Signed: 06/15/2022 5:08:22 PM By: Karie Schwalbe RN Entered By: Karie Schwalbe on 06/15/2022 17:00:55 -------------------------------------------------------------------------------- Pain Assessment Details Patient Name: Date of Service: Betty Morales, [facility] RBA RA 06/15/2022 12:30 PM Medical Record Number: [id number] Patient Account Number: [id number] Date of Birth/Sex: Treating RN: 1940-05-21 (82 y.o. Katrinka Blazing Primary Care Teigan Sahli: Tracey Harries Other Clinician: Referring Leovanni Bjorkman: Treating Jaonna Word/Extender: Vaughan Sine in Treatment: 1 Active Problems Location of Pain Severity and Description of Pain Patient Has Paino Yes Site Locations Pain Location: Pain in Ulcers Pain Management and Medication Current Pain Management: Electronic Signature(s) Signed: 06/15/2022 5:08:22 PM By: Karie Schwalbe RN Entered By: Karie Schwalbe on 06/15/2022 13:00:00 -------------------------------------------------------------------------------- Patient/Caregiver Education Details Patient Name: Date of Service: Betty Morales, BA RBA RA 4/24/2024andnbsp12:30 PM Betty Morales ([id number])  [id number] Page 5 of 17 Medical Record Number: [id number] Patient Account Number: [id number] Date of Birth/Gender: Treating RN: Sep 25, 1940 (82 y.o. Katrinka Blazing Primary Care Physician: Tracey Harries Other Clinician: Referring Physician: Treating Physician/Extender: Vaughan Sine in Treatment: 1 Education Assessment Education Provided To: Patient Education Topics Provided Wound/Skin Impairment: Methods: Explain/Verbal Responses: Return demonstration correctly Electronic Signature(s) Signed: 06/15/2022 5:08:22 PM By: Karie Schwalbe RN Entered By: Karie Schwalbe on 06/15/2022 17:01:09 -------------------------------------------------------------------------------- Wound Assessment Details Patient Name: Date of Service: Betty Morales, Driscilla Grammes [facility] RA 06/15/2022 12:30 PM Medical Record Number: [id number] Patient Account Number: [id number] Date of Birth/Sex: Treating RN: [birth year] (82 y.o. Katrinka Blazing Primary Care Cedric Mcclaine: Tracey Harries Other Clinician: Referring Loden Laurent: Treating Anaid Haney/Extender: Vaughan Sine in Treatment: 1 Wound Status Wound Number: 1 Primary Etiology: Diabetic Wound/Ulcer of the Lower Extremity Wound Location: Right, Lateral Ankle Wound Status: Open Wounding Event: Blister Comorbid History: Hypertension, Type II Diabetes Date Acquired: 02/21/2022 Weeks Of Treatment: 1 Clustered Wound: No Photos Wound Measurements Length: (cm) 2.1 Width: (cm) 1.3 Depth: (cm) 0.7 Area: (cm) 2.144 Volume: (cm) 1.501 % Reduction in Area: 27% % Reduction in Volume: -70.4% Epithelialization: None Tunneling: No Undermining: No Wound Description Classification: Grade 2 Wound Margin: Distinct, outline attached Exudate Amount: Medium Exudate Type: Serosanguineous Exudate Color: red, brown Foul Odor After Cleansing: No Slough/Fibrino No Wound Bed Granulation Amount: Small (1-33%) Exposed  Cindie Laroche ([id number]) [id number] Page 6 of 17 Granulation Quality: Red, Pink Fat Layer (Subcutaneous Tissue) Exposed: Yes Necrotic Amount: Large (67-100%) Necrotic Quality: Eschar, Adherent Slough Periwound Skin Texture Texture Color No Abnormalities Noted: Yes No Abnormalities Noted: No Hemosiderin Staining: Yes Moisture No Abnormalities Noted: No Dry / Scaly: No Maceration: No Treatment Notes Wound #1 (Ankle) Wound Laterality: Right, Lateral Cleanser Vashe 5.8 (oz) Discharge Instruction: Cleanse the wound with Vashe prior to applying a clean dressing using gauze sponges, not tissue or cotton balls. Peri-Wound Care Topical Primary Dressing MediHoney Gel, tube 1.5 (oz) Discharge Instruction: Apply to wound bed as instructed Xeroform Occlusive Gauze Dressing, 4x4 in Discharge Instruction: Apply to wound bed as instructed Secondary Dressing Zetuvit Plus Silicone Border Dressing 4x4 (in/in) Discharge Instruction: Apply silicone border over primary dressing as directed. Secured With Compression Wrap Compression Stockings Facilities manager) Signed: 06/15/2022 4:04:07 PM By: Thayer Dallas Signed: 06/15/2022 5:08:22 PM By: Karie Schwalbe RN Entered By: Thayer Dallas on 06/15/2022 13:15:49 -------------------------------------------------------------------------------- Wound Assessment Details Patient Name: Date of Service: Betty Morales, [place] RBA RA 06/15/2022 12:30 PM Medical Record Number: [id number] Patient Account Number: [id number] Date of Birth/Sex: Treating RN: [birth year] (82 y.o.  Katrinka Blazing Primary Care Manuela Halbur: Tracey Harries Other Clinician: Referring Debroh Sieloff: Treating Odin Mariani/Extender: Vaughan Sine in Treatment: 1 Wound Status Wound Number: 2 Primary Etiology: Diabetic Wound/Ulcer of the Lower Extremity Wound Location: Right, Dorsal T Great oe Wound Status: Open Wounding Event:  Blister Comorbid History: Hypertension, Type II Diabetes Date Acquired: 02/21/2022 Weeks Of Treatment: 1 Clustered Wound: No Pending Amputation On Presentation Photos Betty Morales, Betty Morales ([id number]) [id number] Page 7 of 17 Wound Measurements Length: (cm) 1 Width: (cm) 1.6 Depth: (cm) 0.1 Area: (cm) 1.257 Volume: (cm) 0.126 % Reduction in Area: 27.3% % Reduction in Volume: 63.6% Epithelialization: None Tunneling: No Undermining: No Wound Description Classification: Grade 2 Wound Margin: Distinct, outline attached Exudate Amount: Medium Exudate Type: Serosanguineous Exudate Color: red, brown Foul Odor After Cleansing: No Slough/Fibrino No Wound Bed Granulation Amount: Small (1-33%) Exposed Structure Granulation Quality: Red Fascia Exposed: No Necrotic Amount: Large (67-100%) Fat Layer (Subcutaneous Tissue) Exposed: Yes Necrotic Quality: Eschar, Adherent Slough Tendon Exposed: No Muscle Exposed: No Joint Exposed: No Bone Exposed: Yes Periwound Skin Texture Texture Color No Abnormalities Noted: Yes No Abnormalities Noted: No Atrophie Blanche: No Moisture Cyanosis: No No Abnormalities Noted: No Ecchymosis: No Dry / Scaly: No Erythema: No Maceration: No Hemosiderin Staining: Yes Mottled: No Pallor: No Rubor: No Temperature / Pain Temperature: No Abnormality Treatment Notes Wound #2 (Toe Great) Wound Laterality: Dorsal, Right Cleanser Vashe 5.8 (oz) Discharge Instruction: Cleanse the wound with Vashe prior to applying a clean dressing using gauze sponges, not tissue or cotton balls. Peri-Wound Care Topical Primary Dressing MediHoney Gel, tube 1.5 (oz) Discharge Instruction: Apply to wound bed as instructed Xeroform Occlusive Gauze Dressing, 4x4 in Discharge Instruction: Apply to wound bed as instructed Secondary Dressing Bandaid Secured With Compression Betty Morales ([id number]) [id number] Page 8  of 17 Compression Stockings Add-Ons Electronic Signature(s) Signed: 06/15/2022 4:04:07 PM By: Thayer Dallas Signed: 06/15/2022 5:08:22 PM By: Karie Schwalbe RN Entered By: Thayer Dallas on 06/15/2022 13:12:58 -------------------------------------------------------------------------------- Wound Assessment Details Patient Name: Date of Service: Betty Morales, [place] RBA RA 06/15/2022 12:30 PM Medical Record Number: [id number] Patient Account Number: [id number] Date of Birth/Sex: Treating RN: [birth year] (82 y.o. Katrinka Blazing Primary Care Sandeep Radell: Tracey Harries Other Clinician: Referring Ressie Slevin: Treating Yuritzy Zehring/Extender: Vaughan Sine in Treatment: 1 Wound Status Wound Number: 3 Primary Etiology: Diabetic Wound/Ulcer of the Lower Extremity Wound Location: Right, Dorsal T Second oe Wound Status: Open Wounding Event: Blister Comorbid History: Hypertension, Type II Diabetes Date Acquired: 02/21/2022 Weeks Of Treatment: 1 Clustered Wound: No Pending Amputation On Presentation Photos Wound Measurements Length: (cm) 2.8 Width: (cm) 1.4 Depth: (cm) 0.1 Area: (cm) 3.079 Volume: (cm) 0.308 % Reduction in Area: -151.3% % Reduction in Volume: -150.4% Epithelialization: None Tunneling: No Undermining: No Wound Description Classification: Grade 2 Wound Margin: Distinct, outline attached Exudate Amount: Medium Exudate Type: Serosanguineous Exudate Color: red, brown Foul Odor After Cleansing: No Slough/Fibrino No Wound Bed Granulation Amount: Small (1-33%) Exposed Structure Granulation Quality: Red Fascia Exposed: No Necrotic Amount: Large (67-100%) Fat Layer (Subcutaneous Tissue) Exposed: Yes Necrotic Quality: Eschar, Adherent Slough Tendon Exposed: No Muscle Exposed: No Joint Exposed: No Bone Exposed: Yes Periwound Skin Texture Texture Color No Abnormalities Noted: Yes No Abnormalities Noted: No Atrophie Blanche: No Moisture Cyanosis: No No  Abnormalities Noted: No Ecchymosis: No Dry / Scaly: No Erythema: No Maceration: No Betty Morales, Betty Morales ([id number]) [phone].pdf Page 9 of 17 Erythema: No Maceration: No Hemosiderin Staining: No Mottled: No Pallor: No Rubor: No Temperature / Pain  Temperature: No Abnormality Treatment Notes Wound #3 (Toe Second) Wound Laterality: Dorsal, Right Cleanser Vashe 5.8 (oz) Discharge Instruction: Cleanse the wound with Vashe prior to applying a clean dressing using gauze sponges, not tissue or cotton balls. Peri-Wound Care Topical Primary Dressing MediHoney Gel, tube 1.5 (oz) Discharge Instruction: Apply to wound bed as instructed Xeroform Occlusive Gauze Dressing, 4x4 in Discharge Instruction: Apply to wound bed as instructed Secondary Dressing Bandaid Secured With Compression Wrap Compression Stockings Add-Ons Electronic Signature(s) Signed: 06/15/2022 4:04:07 PM By: Thayer Dallas Signed: 06/15/2022 5:08:22 PM By: Karie Schwalbe RN Entered By: Thayer Dallas on 06/15/2022 13:13:46 -------------------------------------------------------------------------------- Wound Assessment Details Patient Name: Date of Service: Betty Morales, [facility] RBA RA 06/15/2022 12:30 PM Medical Record Number: [id number] Patient Account Number: [id number] Date of Birth/Sex: Treating RN: 10/01/1940 (82 y.o. Katrinka Blazing Primary Care Francies Inch: Tracey Harries Other Clinician: Referring Gessica Jawad: Treating Talmadge Ganas/Extender: Vaughan Sine in Treatment: 1 Wound Status Wound Number: 4 Primary Etiology: Diabetic Wound/Ulcer of the Lower Extremity Wound Location: Right, Dorsal T Third oe Wound Status: Open Wounding Event: Blister Comorbid History: Hypertension, Type II Diabetes Date Acquired: 02/21/2022 Weeks Of Treatment: 1 Clustered Wound: No Pending Amputation On Presentation Photos Betty Morales, Betty Morales ([id number]) [id number] Page 10 of  17 Wound Measurements Length: (cm) 1.1 Width: (cm) 0.7 Depth: (cm) 0.1 Area: (cm) 0.605 Volume: (cm) 0.06 % Reduction in Area: 28.7% % Reduction in Volume: 29.4% Epithelialization: None Tunneling: No Undermining: No Wound Description Classification: Grade 2 Wound Margin: Distinct, outline attached Exudate Amount: Medium Exudate Type: Serosanguineous Exudate Color: red, brown Foul Odor After Cleansing: No Slough/Fibrino No Wound Bed Granulation Amount: Small (1-33%) Exposed Structure Granulation Quality: Red Fascia Exposed: No Necrotic Amount: Medium (34-66%) Fat Layer (Subcutaneous Tissue) Exposed: Yes Necrotic Quality: Eschar Tendon Exposed: No Muscle Exposed: No Joint Exposed: No Bone Exposed: No Periwound Skin Texture Texture Color No Abnormalities Noted: Yes No Abnormalities Noted: No Atrophie Blanche: No Moisture Cyanosis: No No Abnormalities Noted: No Ecchymosis: No Dry / Scaly: No Erythema: No Maceration: No Hemosiderin Staining: No Mottled: No Pallor: No Rubor: No Treatment Notes Wound #4 (Toe Third) Wound Laterality: Dorsal, Right Cleanser Vashe 5.8 (oz) Discharge Instruction: Cleanse the wound with Vashe prior to applying a clean dressing using gauze sponges, not tissue or cotton balls. Peri-Wound Care Topical Primary Dressing MediHoney Gel, tube 1.5 (oz) Discharge Instruction: Apply to wound bed as instructed Xeroform Occlusive Gauze Dressing, 4x4 in Discharge Instruction: Apply to wound bed as instructed Secondary Dressing Bandaid Secured With Compression Wrap Compression Stockings Add-Ons Electronic Signature(s) Signed: 06/15/2022 4:04:07 PM By: Thayer Dallas Signed: 06/15/2022 5:08:22 PM By: Karie Schwalbe RN Entered By: Thayer Dallas on 06/15/2022 13:14:28 -------------------------------------------------------------------------------- Wound Assessment Details Patient Name: Date of Service: Betty Morales, BA RBA RA 06/15/2022 12:30  PM Betty Morales ([id number]) [id number] Page 11 of 17 Medical Record Number: [id number] Patient Account Number: [id number] Date of Birth/Sex: Treating RN: [birth year] (82 y.o. Katrinka Blazing Primary Care Toccara Alford: Tracey Harries Other Clinician: Referring Peggy Loge: Treating Sonam Wandel/Extender: Vaughan Sine in Treatment: 1 Wound Status Wound Number: 5 Primary Etiology: Diabetic Wound/Ulcer of the Lower Extremity Wound Location: Right, Medial Foot Wound Status: Open Wounding Event: Gradually Appeared Comorbid History: Hypertension, Type II Diabetes Date Acquired: 02/21/2022 Weeks Of Treatment: 1 Clustered Wound: No Photos Wound Measurements Length: (cm) 0.6 Width: (cm) 0.7 Depth: (cm) 0.1 Area: (cm) 0.33 Volume: (cm) 0.033 % Reduction in Area: -364.8% % Reduction in Volume: -371.4% Epithelialization: None Tunneling: No Undermining: No Wound Description  Classification: Grade 2 Wound Margin: Distinct, outline attached Exudate Amount: Medium Exudate Type: Serosanguineous Exudate Color: red, brown Foul Odor After Cleansing: No Slough/Fibrino No Wound Bed Granulation Amount: Small (1-33%) Exposed Structure Granulation Quality: Red Fat Layer (Subcutaneous Tissue) Exposed: Yes Necrotic Amount: Large (67-100%) Necrotic Quality: Eschar, Adherent Slough Periwound Skin Texture Texture Color No Abnormalities Noted: Yes No Abnormalities Noted: No Atrophie Blanche: No Moisture Cyanosis: No No Abnormalities Noted: No Ecchymosis: No Dry / Scaly: No Erythema: No Maceration: No Hemosiderin Staining: No Mottled: No Pallor: No Rubor: No Temperature / Pain Temperature: No Abnormality Treatment Notes Wound #5 (Foot) Wound Laterality: Right, Medial Cleanser Vashe 5.8 (oz) Discharge Instruction: Cleanse the wound with Vashe prior to applying a clean dressing using gauze sponges, not tissue or cotton balls. Peri-Wound  Care Topical NOLITA, KUTTER ([id number]) [id number] Page 12 of 17 Primary Dressing Secondary Dressing Zetuvit Plus Silicone Border Dressing 4x4 (in/in) Discharge Instruction: Apply silicone border over primary dressing as directed. Secured With Compression Wrap Compression Stockings Facilities manager) Signed: 06/15/2022 4:04:07 PM By: Thayer Dallas Signed: 06/15/2022 5:08:22 PM By: Karie Schwalbe RN Entered By: Thayer Dallas on 06/15/2022 13:16:48 -------------------------------------------------------------------------------- Wound Assessment Details Patient Name: Date of Service: Betty Morales, [place] RBA RA 06/15/2022 12:30 PM Medical Record Number: [id number] Patient Account Number: [id number] Date of Birth/Sex: Treating RN: Oct 28, 1940 (82 y.o. Katrinka Blazing Primary Care Jamaree Hosier: Tracey Harries Other Clinician: Referring Noel Rodier: Treating Orella Cushman/Extender: Vaughan Sine in Treatment: 1 Wound Status Wound Number: 6 Primary Etiology: Diabetic Wound/Ulcer of the Lower Extremity Wound Location: Left, Dorsal [place] Wound Status: Open Wounding Event: Blister Comorbid History: Hypertension, Type II Diabetes Date Acquired: 02/21/2022 Weeks Of Treatment: 1 Clustered Wound: No Pending Amputation On Presentation Photos Wound Measurements Length: (cm) 1.9 Width: (cm) 2.1 Depth: (cm) 0.3 Area: (cm) 3.134 Volume: (cm) 0.94 % Reduction in Area: -104.6% % Reduction in Volume: -207.2% Epithelialization: None Tunneling: No Undermining: No Wound Description Classification: Grade 2 Wound Margin: Distinct, outline attached Exudate Amount: Medium Exudate Type: Serosanguineous Exudate Color: red, brown Foul Odor After Cleansing: No Slough/Fibrino Yes Wound Bed Granulation Amount: Small (1-33%) Exposed Structure Granulation Quality: Red Fascia Exposed: No Necrotic Amount: Large (67-100%) Fat Layer (Subcutaneous  Tissue) Exposed: Yes Necrotic Quality: Eschar, Adherent Slough Tendon Exposed: No Muscle Exposed: No Joint Exposed: No Betty Morales ([id number]) [phone].pdf Page 13 of 17 Bone Exposed: Yes Periwound Skin Texture Texture Color No Abnormalities Noted: Yes No Abnormalities Noted: No Atrophie Blanche: No Moisture Cyanosis: No No Abnormalities Noted: No Ecchymosis: No Dry / Scaly: No Erythema: No Maceration: No Hemosiderin Staining: No Mottled: No Pallor: No Rubor: No Temperature / Pain Temperature: No Abnormality Treatment Notes Wound #6 (Toe Great) Wound Laterality: Dorsal, Left Cleanser Vashe 5.8 (oz) Discharge Instruction: Cleanse the wound with Vashe prior to applying a clean dressing using gauze sponges, not tissue or cotton balls. Peri-Wound Care Topical Primary Dressing MediHoney Gel, tube 1.5 (oz) Discharge Instruction: Apply to wound bed as instructed Xeroform Occlusive Gauze Dressing, 4x4 in Discharge Instruction: Apply to wound bed as instructed Secondary Dressing Bandaid Secured With Compression Wrap Compression Stockings Add-Ons Electronic Signature(s) Signed: 06/15/2022 4:04:07 PM By: Thayer Dallas Signed: 06/15/2022 5:08:22 PM By: Karie Schwalbe RN Entered By: Thayer Dallas on 06/15/2022 13:17:15 -------------------------------------------------------------------------------- Wound Assessment Details Patient Name: Date of Service: Betty Morales, [facility] RBA RA 06/15/2022 12:30 PM Medical Record Number: [id number] Patient Account Number: [id number] Date of Birth/Sex: Treating RN: November 29, 1940 (82 y.o. Katrinka Blazing Primary Care Betty Morales: Tracey Harries  Other Clinician: Referring Noland Pizano: Treating Damonie Furney/Extender: Vaughan Sine in Treatment: 1 Wound Status Wound Number: 7 Primary Etiology: Diabetic Wound/Ulcer of the Lower Extremity Wound Location: Left, Dorsal T Third oe Wound Status: Open Wounding  Event: Blister Comorbid History: Hypertension, Type II Diabetes Date Acquired: 02/21/2022 Weeks Of Treatment: 1 Clustered Wound: No Pending Amputation On Presentation Photos Betty Morales, Betty Morales ([id number]) [id number] Page 14 of 17 Wound Measurements Length: (cm) 1.4 Width: (cm) 0.9 Depth: (cm) 0.2 Area: (cm) 0.99 Volume: (cm) 0.198 % Reduction in Area: 28.4% % Reduction in Volume: -43.5% Epithelialization: None Wound Description Classification: Grade 2 Wound Margin: Flat and Intact Exudate Amount: Medium Exudate Type: Serosanguineous Exudate Color: red, brown Foul Odor After Cleansing: No Slough/Fibrino No Wound Bed Granulation Amount: Medium (34-66%) Exposed Structure Granulation Quality: Red Fascia Exposed: No Necrotic Amount: Medium (34-66%) Fat Layer (Subcutaneous Tissue) Exposed: Yes Necrotic Quality: Eschar Tendon Exposed: No Muscle Exposed: No Joint Exposed: No Bone Exposed: Yes Periwound Skin Texture Texture Color No Abnormalities Noted: Yes No Abnormalities Noted: No Atrophie Blanche: No Moisture Cyanosis: No No Abnormalities Noted: No Ecchymosis: No Dry / Scaly: No Erythema: No Maceration: No Hemosiderin Staining: No Mottled: No Pallor: No Rubor: No Treatment Notes Wound #7 (Toe Third) Wound Laterality: Dorsal, Left Cleanser Vashe 5.8 (oz) Discharge Instruction: Cleanse the wound with Vashe prior to applying a clean dressing using gauze sponges, not tissue or cotton balls. Peri-Wound Care Topical Primary Dressing MediHoney Gel, tube 1.5 (oz) Discharge Instruction: Apply to wound bed as instructed Xeroform Occlusive Gauze Dressing, 4x4 in Discharge Instruction: Apply to wound bed as instructed Secondary Dressing Bandaid Secured With Compression Wrap Compression Stockings Add-Ons Betty Morales ([id number]) [id number] Page 15 of 17 Electronic Signature(s) Signed: 06/15/2022 4:04:07 PM By:  Thayer Dallas Signed: 06/15/2022 5:08:22 PM By: Karie Schwalbe RN Entered By: Thayer Dallas on 06/15/2022 13:17:43 -------------------------------------------------------------------------------- Wound Assessment Details Patient Name: Date of Service: Betty Morales, [place] RBA RA 06/15/2022 12:30 PM Medical Record Number: [id number] Patient Account Number: [id number] Date of Birth/Sex: Treating RN: 17-Jul-1940 (82 y.o. Katrinka Blazing Primary Care Siyona Coto: Tracey Harries Other Clinician: Referring Betty Morales: Treating Kynlie Jane/Extender: Vaughan Sine in Treatment: 1 Wound Status Wound Number: 8 Primary Etiology: Diabetic Wound/Ulcer of the Lower Extremity Wound Location: Left, Anterior Calcaneus Wound Status: Open Wounding Event: Blister Comorbid History: Hypertension, Type II Diabetes Date Acquired: 02/21/2022 Weeks Of Treatment: 1 Clustered Wound: No Photos Wound Measurements Length: (cm) 0.5 Width: (cm) 0.5 Depth: (cm) 0.1 Area: (cm) 0.196 Volume: (cm) 0.02 % Reduction in Area: 60.4% % Reduction in Volume: 59.2% Epithelialization: None Tunneling: No Undermining: No Wound Description Classification: Grade 2 Wound Margin: Flat and Intact Exudate Amount: Medium Exudate Type: Serosanguineous Exudate Color: red, brown Foul Odor After Cleansing: No Slough/Fibrino Yes Wound Bed Granulation Amount: None Present (0%) Exposed Structure Necrotic Amount: Large (67-100%) Fascia Exposed: No Necrotic Quality: Eschar Fat Layer (Subcutaneous Tissue) Exposed: Yes Tendon Exposed: No Muscle Exposed: No Joint Exposed: No Bone Exposed: No Periwound Skin Texture Texture Color No Abnormalities Noted: Yes No Abnormalities Noted: No Atrophie Blanche: No Moisture Cyanosis: No No Abnormalities Noted: No Ecchymosis: No Dry / Scaly: No Erythema: No Maceration: No Hemosiderin Staining: No Mottled: No Pallor: No Rubor: No Betty Morales, Betty Morales ([id number])  [id number].pdf Page 16 of 17 Temperature / Pain Temperature: No Abnormality Treatment Notes Wound #8 (Calcaneus) Wound Laterality: Left, Anterior Cleanser Vashe 5.8 (oz) Discharge Instruction: Cleanse the wound with Vashe prior to applying a clean dressing using gauze sponges, not tissue  or cotton balls. Peri-Wound Care Topical Primary Dressing Betadine Secondary Dressing Zetuvit Plus Silicone Border Dressing 4x4 (in/in) Discharge Instruction: Apply silicone border over primary dressing as directed. Secured With Compression Wrap Compression Stockings Add-Ons Electronic Signature(s) Signed: 06/15/2022 5:08:22 PM By: Karie Schwalbe RN Entered By: Karie Schwalbe on 06/15/2022 13:58:57 -------------------------------------------------------------------------------- Wound Assessment Details Patient Name: Date of Service: Betty Morales, [facility] RA 06/15/2022 12:30 PM Medical Record Number: [id number] Patient Account Number: [id number] Date of Birth/Sex: Treating RN: August 23, 1940 (82 y.o. Katrinka Blazing Primary Care Amere Iott: Tracey Harries Other Clinician: Referring Alleen Kehm: Treating Twylia Oka/Extender: Vaughan Sine in Treatment: 1 Wound Status Wound Number: 9 Primary Etiology: Trauma, Other Wound Location: Left T Fourth oe Wound Status: Open Wounding Event: Blister Comorbid History: Hypertension, Type II Diabetes Date Acquired: 06/10/2022 Weeks Of Treatment: 0 Clustered Wound: No Photos Wound Measurements Length: (cm) 0.4 Width: (cm) 0.4 Depth: (cm) 0.1 Area: (cm) 0.126 Volume: (cm) 0.013 % Reduction in Area: % Reduction in Volume: Epithelialization: None Tunneling: No Undermining: No Wound Description Betty, Morales ([id number]) Classification: Full Thickness Without Exposed Support Structures Exudate Amount: Medium Exudate Type: Serosanguineous Exudate Color: red, brown [phone].pdf Page 17 of  17 Foul Odor After Cleansing: No Slough/Fibrino Yes Wound Bed Granulation Amount: Small (1-33%) Exposed Structure Granulation Quality: Red Fascia Exposed: No Necrotic Amount: Large (67-100%) Fat Layer (Subcutaneous Tissue) Exposed: No Necrotic Quality: Eschar Tendon Exposed: No Muscle Exposed: No Joint Exposed: No Bone Exposed: No Periwound Skin Texture Texture Color No Abnormalities Noted: Yes No Abnormalities Noted: Yes Moisture Temperature / Pain No Abnormalities Noted: Yes Temperature: No Abnormality Treatment Notes Wound #9 (Toe Fourth) Wound Laterality: Left Cleanser Vashe 5.8 (oz) Discharge Instruction: Cleanse the wound with Vashe prior to applying a clean dressing using gauze sponges, not tissue or cotton balls. Peri-Wound Care Topical Primary Dressing Betadine Secondary Dressing Zetuvit Plus Silicone Border Dressing 4x4 (in/in) Discharge Instruction: Apply silicone border over primary dressing as directed. Secured With Compression Wrap Compression Stockings Add-Ons Electronic Signature(s) Signed: 06/15/2022 5:08:22 PM By: Karie Schwalbe RN Entered By: Karie Schwalbe on 06/15/2022 13:40:33 -------------------------------------------------------------------------------- Vitals Details Patient Name: Date of Service: Betty Morales, [facility] RBA RA 06/15/2022 12:30 PM Medical Record Number: [id number] Patient Account Number: [id number] Date of Birth/Sex: Treating RN: June 09, 1940 (82 y.o. Katrinka Blazing Primary Care Damauri Minion: Tracey Harries Other Clinician: Referring Paullette Mckain: Treating Keysi Oelkers/Extender: Vaughan Sine in Treatment: 1 Vital Signs Time Taken: 12:59 Pulse (bpm): 64 Height (in): 67 Respiratory Rate (breaths/min): 18 Weight (lbs): 125 Blood Pressure (mmHg): 111/63 Body Mass Index (BMI): 19.6 Reference Range: 80 - 120 mg / dl Electronic Signature(s) Signed: 06/15/2022 5:08:22 PM By: Karie Schwalbe RN Entered By: Karie Schwalbe on  06/15/2022 12:59:44

## 2022-06-16 NOTE — Progress Notes (Addendum)
CIRCE, CHILTON ([id number]) [id number] Page 1 of 9 Visit Report for 06/15/2022 Chief Complaint Document Details Patient Name: Date of Service: Betty Morales [facility] RA 06/15/2022 12:30 PM Medical Record Number: [id number] Patient Account Number: [id number] Date of Birth/Sex: Treating RN: [birth year] (82 y.o. F) Primary Care Provider: Tracey Harries Other Clinician: Referring Provider: Treating Provider/Extender: Vaughan Sine in Treatment: 1 Information Obtained from: Patient Chief Complaint Bilateral LE Ulcers Electronic Signature(s) Signed: 06/15/2022 1:15:14 PM By: Allen Derry PA-C Entered By: Allen Derry on 06/15/2022 13:15:14 -------------------------------------------------------------------------------- HPI Details Patient Name: Date of Service: Betty Morales, [place] RBA RA 06/15/2022 12:30 PM Medical Record Number: [id number] Patient Account Number: [id number] Date of Birth/Sex: Treating RN: [birth year] (82 y.o. F) Primary Care Provider: Tracey Harries Other Clinician: Referring Provider: Treating Provider/Extender: Vaughan Sine in Treatment: 1 History of Present Illness HPI Description: 06-08-2022 upon evaluation today patient presents for initial inspection here in our clinic concerning issues that she has been having with wounds over her right lower extremity and bilateral feet. There really is not a cause for what exactly has led to these as she seems to have good arterial flow she has had this checked according to her son when she was in the hospital at [facility]. They told her that everything was fine from a blood flow standpoint. Also we did check her ABIs today and they seem to be doing well at 0.98 bilaterally. Again everything seems to point to having good blood flow. She does however have wounds that are in areas of prominence that seem to be more of a rubbing type situation. Apparently she has had wounds like this in the  past and then subsequently they have had to treat them the home health nurse that is and have gotten better. She is in assisted living facility. With that being said unfortunately with this wound things just have not gotten better and has been going on since at least the first of the year her signs not exactly sure when this really began to be honest. He is also not exactly sure where the orders for current wound care measures have come from Betadine and keeping things dry as what is been utilized up to this point. With that being said I do not think this could be appropriate going forward. Patient does have a hemoglobin A1c that was on March 21, 2022 which was 6.1. I also did review the CBC which showed a hemoglobin of 12.2 with hematocrit of 38. On her CMP of note her total protein was 6.1 with albumin of 2.9 and all the labs except for the A1c were performed on 01-14-2022. She also other than the diabetes mellitus type 2 has a history of pulmonary fibrosis, atrial fibrillation with long-term use of anticoagulants with Eliquis, coronary artery disease, hemiplegia affecting her right nondominant side and also her speech, and hypertension. 06-15-2022 upon evaluation today patient appears to be doing Okay currently in regard to her wound all things considered she still has quite a bit going on as far as the feet are concerned we did get the results back from the culture as well as the biopsies unfortunately she does have osteomyelitis pretty much across the board in both feet in regard to her toes. I am not sure how far this extends but I do believe that she is going to require Monitoring to ensure things do not get worse. With that being said I am not certain that she is really in  a state currently medically in order to proceed with any aggressive intervention. This could not tell even having to consider bilateral transmetatarsal amputations. That being said that does not sound like something that is  likely can to be chosen by the patient to do at this point but I cannot say for sure I am definitely going to give him the options and then let her and her son talk about it. The other option of course is to continue with the wound care measures as we have at this point. I think that we can continue to monitor for any signs of worsening from the standpoint of infection and do what we can as far as trying to get this to heal there just is no guarantee that we will ever be able to truly get them to close. I was very upfront and honest with him about this. Electronic Signature(s) Signed: 06/15/2022 5:01:12 PM By: Allen Derry PA-C Entered By: Allen Derry on 06/15/2022 17:01:12 -------------------------------------------------------------------------------- Physical Exam Details Patient Name: Date of Service: Betty Morales [facility] RA 06/15/2022 12:30 PM Medical Record Number: [id number] Patient Account Number: [id number] Date of Birth/Sex: Treating RN: 02-21-1941 (82 y.o. F) Primary Care Provider: Tracey Harries Other Clinician: Sherrye Payor ([id number]) [id number] Page 2 of 9 Referring Provider: Treating Provider/Extender: Vaughan Sine in Treatment: 1 Constitutional Chronically ill appearing but in no apparent acute distress. Respiratory normal breathing without difficulty. Psychiatric this patient is able to make decisions and demonstrates good insight into disease process. Alert and Oriented x 3. pleasant and cooperative. Notes Upon inspection patient's wounds again showed signs of continued and ongoing necrotic tissue noted at multiple areas with bone exposure in regards to her toes I am really not as worried about the wounds on the leg as well as the wounds on the ankle and heel location of the seem to be very superficial. With that being said the wounds on the feet I do feel like I go to be the biggest concern but again I am not stating that we  need to really proceed with any type of amputation I am not certain that that would do well for her that at the same time I do not want them to do get the impression that I think we will go to be able to get these completely healed they could heal but there is no guarantee. Electronic Signature(s) Signed: 06/15/2022 5:02:01 PM By: Allen Derry PA-C Entered By: Allen Derry on 06/15/2022 17:02:01 -------------------------------------------------------------------------------- Physician Orders Details Patient Name: Date of Service: Betty Morales, [place] RBA RA 06/15/2022 12:30 PM Medical Record Number: [id number] Patient Account Number: [id number] Date of Birth/Sex: Treating RN: 11-Oct-1940 (82 y.o. Katrinka Blazing Primary Care Provider: Tracey Harries Other Clinician: Referring Provider: Treating Provider/Extender: Vaughan Sine in Treatment: 1 Verbal / Phone Orders: No Diagnosis Coding ICD-10 Coding Code Description E11.621 Type 2 diabetes mellitus with foot ulcer L97.312 Non-pressure chronic ulcer of right ankle with fat layer exposed 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 I10 Essential (primary) hypertension G81.93 Hemiplegia, unspecified affecting right nondominant side I48.0 Paroxysmal atrial fibrillation J84.10 Pulmonary fibrosis, unspecified Z79.01 Long term (current) use of anticoagulants I25.10 Atherosclerotic heart disease of native coronary artery without angina pectoris Follow-up Appointments ppointment in 2 weeks. - [facility] Room 7 Return A *** Extra Time, at least 60 mins. Other: - **  Antibiotics 06/15/22 06/08/22 Culture and biopsy- once results are back -PCP educated pt. and family. Anesthetic (In clinic) Topical Lidocaine 4% applied to wound bed - Used in Clinic Bathing/ Shower/ Hygiene May shower with protection but do not get  wound dressing(s) wet. Protect dressing(s) with water repellant cover (for example, large plastic bag) or a cast cover and may then take shower. Off-Loading Open toe surgical shoe to: - Bilateral feet Additional Orders / Instructions Follow Nutritious Diet - increase protein Home Health New wound care orders this week; continue Home Health for wound care. May utilize formulary equivalent dressing for wound treatment orders unless otherwise specified. - [facility] please see patient twice a week and the wound center will do [place], Betty Morales ([id number]) [id number] Page 3 of 9 wound care once a week and the other week where the patient does not come to wound center, please perform wound care 3 xweek. Other Home Health Orders/Instructions: - [facility] Wound Treatment Wound #1 - Ankle Wound Laterality: Right, Lateral Cleanser: Vashe 5.8 (oz) (Home Health) 3 x Per Week/30 Days Discharge Instructions: Cleanse the wound with Vashe prior to applying a clean dressing using gauze sponges, not tissue or cotton balls. Prim Dressing: MediHoney Gel, tube 1.5 (oz) (Home Health) 3 x Per Week/30 Days ary Discharge Instructions: Apply to wound bed as instructed Prim Dressing: Xeroform Occlusive Gauze Dressing, 4x4 in (Home Health) 3 x Per Week/30 Days ary Discharge Instructions: Apply to wound bed as instructed Secondary Dressing: Zetuvit Plus Silicone Border Dressing 4x4 (in/in) (Home Health) 3 x Per Week/30 Days Discharge Instructions: Apply silicone border over primary dressing as directed. Wound #2 - T Great oe Wound Laterality: Dorsal, Right Cleanser: Vashe 5.8 (oz) (Home Health) 3 x Per Week/30 Days Discharge Instructions: Cleanse the wound with Vashe prior to applying a clean dressing using gauze sponges, not tissue or cotton balls. Prim Dressing: MediHoney Gel, tube 1.5 (oz) (Home Health) 3 x Per Week/30 Days ary Discharge  Instructions: Apply to wound bed as instructed Prim Dressing: Xeroform Occlusive Gauze Dressing, 4x4 in (Home Health) 3 x Per Week/30 Days ary Discharge Instructions: Apply to wound bed as instructed Secondary Dressing: Bandaid (Home Health) 3 x Per Week/30 Days Wound #3 - T Second oe Wound Laterality: Dorsal, Right Cleanser: Vashe 5.8 (oz) (Home Health) 3 x Per Week/30 Days Discharge Instructions: Cleanse the wound with Vashe prior to applying a clean dressing using gauze sponges, not tissue or cotton balls. Prim Dressing: MediHoney Gel, tube 1.5 (oz) (Home Health) 3 x Per Week/30 Days ary Discharge Instructions: Apply to wound bed as instructed Prim Dressing: Xeroform Occlusive Gauze Dressing, 4x4 in (Home Health) 3 x Per Week/30 Days ary Discharge Instructions: Apply to wound bed as instructed Secondary Dressing: Bandaid (Home Health) 3 x Per Week/30 Days Wound #4 - T Third oe Wound Laterality: Dorsal, Right Cleanser: Vashe 5.8 (oz) (Home Health) 3 x Per Week/30 Days Discharge Instructions: Cleanse the wound with Vashe prior to applying a clean dressing using gauze sponges, not tissue or cotton balls. Prim Dressing: MediHoney Gel, tube 1.5 (oz) (Home Health) 3 x Per Week/30 Days ary Discharge Instructions: Apply to wound bed as instructed Prim Dressing: Xeroform Occlusive Gauze Dressing, 4x4 in (Home Health) 3 x Per Week/30 Days ary Discharge Instructions: Apply to wound bed as instructed Secondary Dressing: Bandaid (Home Health) 3 x Per Week/30 Days Wound #5 - Foot Wound Laterality: Right, Medial Cleanser: Vashe 5.8 (oz) 3 x Per Week/30 Days Discharge Instructions:  Cleanse the wound with Vashe prior to applying a clean dressing using gauze sponges, not tissue or cotton balls. Secondary Dressing: Zetuvit Plus Silicone Border Dressing 4x4 (in/in) 3 x Per Week/30 Days Discharge Instructions: Apply silicone border over primary dressing as directed. Wound #6 - T Great oe Wound  Laterality: Dorsal, Left Cleanser: Vashe 5.8 (oz) (Home Health) 3 x Per Week/30 Days Discharge Instructions: Cleanse the wound with Vashe prior to applying a clean dressing using gauze sponges, not tissue or cotton balls. Prim Dressing: MediHoney Gel, tube 1.5 (oz) (Home Health) 3 x Per Week/30 Days ary Discharge Instructions: Apply to wound bed as instructed Prim Dressing: Xeroform Occlusive Gauze Dressing, 4x4 in (Home Health) 3 x Per Week/30 Days ary Discharge Instructions: Apply to wound bed as instructed Secondary Dressing: Bandaid (Home Health) 3 x Per Week/30 Days Wound #7 - T Third oe Wound Laterality: Dorsal, Left Cleanser: Vashe 5.8 (oz) (Home Health) 3 x Per Week/30 Days Betty Morales, Betty Morales ([id number]) [id number] Page 4 of 9 Discharge Instructions: Cleanse the wound with Vashe prior to applying a clean dressing using gauze sponges, not tissue or cotton balls. Prim Dressing: MediHoney Gel, tube 1.5 (oz) (Home Health) 3 x Per Week/30 Days ary Discharge Instructions: Apply to wound bed as instructed Prim Dressing: Xeroform Occlusive Gauze Dressing, 4x4 in (Home Health) 3 x Per Week/30 Days ary Discharge Instructions: Apply to wound bed as instructed Secondary Dressing: Bandaid (Home Health) 3 x Per Week/30 Days Wound #8 - Calcaneus Wound Laterality: Left, Anterior Cleanser: Vashe 5.8 (oz) 3 x Per Week/30 Days Discharge Instructions: Cleanse the wound with Vashe prior to applying a clean dressing using gauze sponges, not tissue or cotton balls. Prim Dressing: Betadine ary 3 x Per Week/30 Days Secondary Dressing: Zetuvit Plus Silicone Border Dressing 4x4 (in/in) 3 x Per Week/30 Days Discharge Instructions: Apply silicone border over primary dressing as directed. Wound #9 - T Fourth oe Wound Laterality: Left Cleanser: Vashe 5.8 (oz) 3 x Per Week/30 Days Discharge Instructions: Cleanse the wound with Vashe prior to applying a clean dressing using gauze  sponges, not tissue or cotton balls. Prim Dressing: Betadine ary 3 x Per Week/30 Days Secondary Dressing: Zetuvit Plus Silicone Border Dressing 4x4 (in/in) 3 x Per Week/30 Days Discharge Instructions: Apply silicone border over primary dressing as directed. Patient Medications llergies: penicillin G, etanercept, infliximab, lisinopril, phenobarbital, tramadol A Notifications Medication Indication Start End 06/15/2022 Bactrim DS DOSE 1 - oral 800 mg-160 mg tablet - 1 tablet oral twice a day x 30 days Electronic Signature(s) Signed: 06/15/2022 5:08:22 PM By: Karie Schwalbe RN Signed: 06/15/2022 5:22:52 PM By: Allen Derry PA-C Previous Signature: 06/15/2022 1:58:41 PM Version By: Allen Derry PA-C Entered By: Karie Schwalbe on 06/15/2022 14:05:14 -------------------------------------------------------------------------------- Problem List Details Patient Name: Date of Service: Betty Morales, [facility] RBA RA 06/15/2022 12:30 PM Medical Record Number: [id number] Patient Account Number: [id number] Date of Birth/Sex: Treating RN: 1941/02/03 (82 y.o. F) Primary Care Provider: Tracey Harries Other Clinician: Referring Provider: Treating Provider/Extender: Vaughan Sine in Treatment: 1 Active Problems ICD-10 Encounter Code Description Active Date MDM Diagnosis E11.621 Type 2 diabetes mellitus with foot ulcer 06/08/2022 No Yes L97.312 Non-pressure chronic ulcer of right ankle with fat layer exposed 06/08/2022 No Yes L97.512 Non-pressure chronic ulcer of other part of right foot with fat layer exposed 06/08/2022 No Yes Betty Morales, Betty Morales ([id number]) [id number] Page 5 of 9 [phone] Non-pressure chronic ulcer of other part of left foot with fat layer exposed 06/08/2022 No Yes I10 Essential (primary)  hypertension 06/08/2022 No Yes G81.93 Hemiplegia, unspecified affecting right nondominant side 06/08/2022 No Yes I48.0 Paroxysmal atrial fibrillation 06/08/2022 No  Yes J84.10 Pulmonary fibrosis, unspecified 06/08/2022 No Yes Z79.01 Long term (current) use of anticoagulants 06/08/2022 No Yes I25.10 Atherosclerotic heart disease of native coronary artery without angina pectoris 06/08/2022 No Yes Inactive Problems Resolved Problems Electronic Signature(s) Signed: 06/15/2022 1:08:23 PM By: Allen Derry PA-C Entered By: Allen Derry on 06/15/2022 13:08:23 -------------------------------------------------------------------------------- Progress Note Details Patient Name: Date of Service: Betty Morales, [facility] RBA RA 06/15/2022 12:30 PM Medical Record Number: [id number] Patient Account Number: [id number] Date of Birth/Sex: Treating RN: May 18, 1940 (82 y.o. F) Primary Care Provider: Tracey Harries Other Clinician: Referring Provider: Treating Provider/Extender: Vaughan Sine in Treatment: 1 Subjective Chief Complaint Information obtained from Patient Bilateral LE Ulcers History of Present Illness (HPI) 06-08-2022 upon evaluation today patient presents for initial inspection here in our clinic concerning issues that she has been having with wounds over her right lower extremity and bilateral feet. There really is not a cause for what exactly has led to these as she seems to have good arterial flow she has had this checked according to her son when she was in the hospital at [facility]. They told her that everything was fine from a blood flow standpoint. Also we did check her ABIs today and they seem to be doing well at 0.98 bilaterally. Again everything seems to point to having good blood flow. She does however have wounds that are in areas of prominence that seem to be more of a rubbing type situation. Apparently she has had wounds like this in the past and then subsequently they have had to treat them the home health nurse that is and have gotten better. She is in assisted living facility. With that being said unfortunately with this wound things just have  not gotten better and has been going on since at least the first of the year her signs not exactly sure when this really began to be honest. He is also not exactly sure where the orders for current wound care measures have come from Betadine and keeping things dry as what is been utilized up to this point. With that being said I do not think this could be appropriate going forward. Patient does have a hemoglobin A1c that was on March 21, 2022 which was 6.1. I also did review the CBC which showed a hemoglobin of 12.2 with hematocrit of 38. On her CMP of note her total protein was 6.1 with albumin of 2.9 and all the labs except for the A1c were performed on 01-14-2022. She also other than the diabetes mellitus type 2 has a history of pulmonary fibrosis, atrial fibrillation with long-term use of anticoagulants with Eliquis, coronary artery disease, hemiplegia affecting her right nondominant side and also her speech, and hypertension. 06-15-2022 upon evaluation today patient appears to be doing Okay currently in regard to her wound all things considered she still has quite a bit going on as far as the feet are concerned we did get the results back from the culture as well as the biopsies unfortunately she does have osteomyelitis pretty much across the board in both feet in regard to her toes. I am not sure how far this extends but I do believe that she is going to require Monitoring to ensure things do not get worse. With that being said I am not certain that she is really in a state currently medically in order to proceed  with any aggressive intervention. This could not tell even having to consider bilateral transmetatarsal amputations. That being said that does not sound like something that is likely can to be chosen by the patient to do at this point but I cannot say for sure I am definitely going to give him the options and then let her and her son talk about it. The other option of course is to  continue with the wound care measures as we have at this point. I think that we can continue to monitor for any signs of worsening from the [place], Betty Morales ([id number]) [id number] Page 6 of 9 standpoint of infection and do what we can as far as trying to get this to heal there just is no guarantee that we will ever be able to truly get them to close. I was very upfront and honest with him about this. Objective Constitutional Chronically ill appearing but in no apparent acute distress. Vitals Time Taken: 12:59 PM, Height: 67 in, Weight: 125 lbs, BMI: 19.6, Pulse: 64 bpm, Respiratory Rate: 18 breaths/min, Blood Pressure: 111/63 mmHg. Respiratory normal breathing without difficulty. Psychiatric this patient is able to make decisions and demonstrates good insight into disease process. Alert and Oriented x 3. pleasant and cooperative. General Notes: Upon inspection patient's wounds again showed signs of continued and ongoing necrotic tissue noted at multiple areas with bone exposure in regards to her toes I am really not as worried about the wounds on the leg as well as the wounds on the ankle and heel location of the seem to be very superficial. With that being said the wounds on the feet I do feel like I go to be the biggest concern but again I am not stating that we need to really proceed with any type of amputation I am not certain that that would do well for her that at the same time I do not want them to do get the impression that I think we will go to be able to get these completely healed they could heal but there is no guarantee. Integumentary (Hair, Skin) Wound #1 status is Open. Original cause of wound was Blister. The date acquired was: 02/21/2022. The wound has been in treatment 1 weeks. The wound is located on the Right,Lateral Ankle. The wound measures 2.1cm length x 1.3cm width x 0.7cm depth; 2.144cm^2 area and 1.501cm^3 volume. There is Fat Layer (Subcutaneous  Tissue) exposed. There is no tunneling or undermining noted. There is a medium amount of serosanguineous drainage noted. The wound margin is distinct with the outline attached to the wound base. There is small (1-33%) red, pink granulation within the wound bed. There is a large (67-100%) amount of necrotic tissue within the wound bed including Eschar and Adherent Slough. The periwound skin appearance had no abnormalities noted for texture. The periwound skin appearance exhibited: Hemosiderin Staining. The periwound skin appearance did not exhibit: Dry/Scaly, Maceration. Wound #2 status is Open. Original cause of wound was Blister. The date acquired was: 02/21/2022. The wound has been in treatment 1 weeks. The wound is located on the Right,Dorsal T [place]. The wound measures 1cm length x 1.6cm width x 0.1cm depth; 1.257cm^2 area and 0.126cm^3 volume. There is bone oe and Fat Layer (Subcutaneous Tissue) exposed. There is no tunneling or undermining noted. There is a medium amount of serosanguineous drainage noted. The wound margin is distinct with the outline attached to the wound base. There is small (1-33%) red granulation within the wound bed. There is a  large (67-100%) amount of necrotic tissue within the wound bed including Eschar and Adherent Slough. The periwound skin appearance had no abnormalities noted for texture. The periwound skin appearance exhibited: Hemosiderin Staining. The periwound skin appearance did not exhibit: Dry/Scaly, Maceration, Atrophie Blanche, Cyanosis, Ecchymosis, Mottled, Pallor, Rubor, Erythema. Periwound temperature was noted as No Abnormality. Wound #3 status is Open. Original cause of wound was Blister. The date acquired was: 02/21/2022. The wound has been in treatment 1 weeks. The wound is located on the Right,Dorsal T [place]. The wound measures 2.8cm length x 1.4cm width x 0.1cm depth; 3.079cm^2 area and 0.308cm^3 volume. There is oe bone and Fat Layer (Subcutaneous  Tissue) exposed. There is no tunneling or undermining noted. There is a medium amount of serosanguineous drainage noted. The wound margin is distinct with the outline attached to the wound base. There is small (1-33%) red granulation within the wound bed. There is a large (67-100%) amount of necrotic tissue within the wound bed including Eschar and Adherent Slough. The periwound skin appearance had no abnormalities noted for texture. The periwound skin appearance did not exhibit: Dry/Scaly, Maceration, Atrophie Blanche, Cyanosis, Ecchymosis, Hemosiderin Staining, Mottled, Pallor, Rubor, Erythema. Periwound temperature was noted as No Abnormality. Wound #4 status is Open. Original cause of wound was Blister. The date acquired was: 02/21/2022. The wound has been in treatment 1 weeks. The wound is located on the Right,Dorsal T Third. The wound measures 1.1cm length x 0.7cm width x 0.1cm depth; 0.605cm^2 area and 0.06cm^3 volume. There is Fat oe Layer (Subcutaneous Tissue) exposed. There is no tunneling or undermining noted. There is a medium amount of serosanguineous drainage noted. The wound margin is distinct with the outline attached to the wound base. There is small (1-33%) red granulation within the wound bed. There is a medium (34-66%) amount of necrotic tissue within the wound bed including Eschar. The periwound skin appearance had no abnormalities noted for texture. The periwound skin appearance did not exhibit: Dry/Scaly, Maceration, Atrophie Blanche, Cyanosis, Ecchymosis, Hemosiderin Staining, Mottled, Pallor, Rubor, Erythema. Wound #5 status is Open. Original cause of wound was Gradually Appeared. The date acquired was: 02/21/2022. The wound has been in treatment 1 weeks. The wound is located on the Right,Medial Foot. The wound measures 0.6cm length x 0.7cm width x 0.1cm depth; 0.33cm^2 area and 0.033cm^3 volume. There is Fat Layer (Subcutaneous Tissue) exposed. There is no tunneling or undermining  noted. There is a medium amount of serosanguineous drainage noted. The wound margin is distinct with the outline attached to the wound base. There is small (1-33%) red granulation within the wound bed. There is a large (67-100%) amount of necrotic tissue within the wound bed including Eschar and Adherent Slough. The periwound skin appearance had no abnormalities noted for texture. The periwound skin appearance did not exhibit: Dry/Scaly, Maceration, Atrophie Blanche, Cyanosis, Ecchymosis, Hemosiderin Staining, Mottled, Pallor, Rubor, Erythema. Periwound temperature was noted as No Abnormality. Wound #6 status is Open. Original cause of wound was Blister. The date acquired was: 02/21/2022. The wound has been in treatment 1 weeks. The wound is located on the Left,Dorsal T Great. The wound measures 1.9cm length x 2.1cm width x 0.3cm depth; 3.134cm^2 area and 0.94cm^3 volume. There is bone oe and Fat Layer (Subcutaneous Tissue) exposed. There is no tunneling or undermining noted. There is a medium amount of serosanguineous drainage noted. The wound margin is distinct with the outline attached to the wound base. There is small (1-33%) red granulation within the wound bed. There is a  large (67-100%) amount of necrotic tissue within the wound bed including Eschar and Adherent Slough. The periwound skin appearance had no abnormalities noted for texture. The periwound skin appearance did not exhibit: Dry/Scaly, Maceration, Atrophie Blanche, Cyanosis, Ecchymosis, Hemosiderin Staining, Mottled, Pallor, Rubor, Erythema. Periwound temperature was noted as No Abnormality. Wound #7 status is Open. Original cause of wound was Blister. The date acquired was: 02/21/2022. The wound has been in treatment 1 weeks. The wound is located on the Left,Dorsal T Third. The wound measures 1.4cm length x 0.9cm width x 0.2cm depth; 0.99cm^2 area and 0.198cm^3 volume. There is bone oe and Fat Layer (Subcutaneous Tissue) exposed. There  is a medium amount of serosanguineous drainage noted. The wound margin is flat and intact. There is medium (34-66%) red granulation within the wound bed. There is a medium (34-66%) amount of necrotic tissue within the wound bed including Eschar. The periwound skin appearance had no abnormalities noted for texture. The periwound skin appearance did not exhibit: Dry/Scaly, Maceration, Atrophie Blanche, Cyanosis, Ecchymosis, Hemosiderin Staining, Mottled, Pallor, Rubor, Erythema. Wound #8 status is Open. Original cause of wound was Blister. The date acquired was: 02/21/2022. The wound has been in treatment 1 weeks. The wound is located on the Left,Anterior Calcaneus. The wound measures 0.5cm length x 0.5cm width x 0.1cm depth; 0.196cm^2 area and 0.02cm^3 volume. There is Fat Layer (Subcutaneous Tissue) exposed. There is no tunneling or undermining noted. There is a medium amount of serosanguineous drainage noted. The wound margin is flat and intact. There is no granulation within the wound bed. There is a large (67-100%) amount of necrotic tissue within the wound bed including Eschar. The periwound skin appearance had no abnormalities noted for texture. The periwound skin appearance did not exhibit: Dry/Scaly, Tyja, Gortney ([id number]) [id number] Page 7 of 9 Blanche, Cyanosis, Ecchymosis, Hemosiderin Staining, Mottled, Pallor, Rubor, Erythema. Periwound temperature was noted as No Abnormality. Wound #9 status is Open. Original cause of wound was Blister. The date acquired was: 06/10/2022. The wound is located on the Left T Fourth. The wound oe measures 0.4cm length x 0.4cm width x 0.1cm depth; 0.126cm^2 area and 0.013cm^3 volume. There is no tunneling or undermining noted. There is a medium amount of serosanguineous drainage noted. There is small (1-33%) red granulation within the wound bed. There is a large (67-100%) amount of necrotic tissue within the  wound bed including Eschar. The periwound skin appearance had no abnormalities noted for texture. The periwound skin appearance had no abnormalities noted for moisture. The periwound skin appearance had no abnormalities noted for color. Periwound temperature was noted as No Abnormality. 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 - Betty Morales Room 7 *** Extra Time, at least 60 mins. Other: - ** Antibiotics 06/15/22 06/08/22 Culture and biopsy- once results are back -PCP educated pt. and family. Anesthetic: (In clinic) Topical Lidocaine 4% applied to wound bed - Used in Clinic Bathing/ Shower/ Hygiene: May shower with protection but do not get wound dressing(s) wet. Protect dressing(s) with water repellant cover (for example, large plastic  bag) or a cast cover and may then take shower. Off-Loading: Open toe surgical shoe to: - Bilateral feet Additional Orders / Instructions: Follow Nutritious Diet - increase protein Home Health: New wound care orders this week; continue Home Health for wound care. May utilize formulary equivalent dressing for wound treatment orders unless otherwise specified. - [facility] please see patient twice a week and the wound center will do wound care once a week and the other week where the patient does not come to wound center, please perform wound care 3  xweek. Other Home Health Orders/Instructions: - [facility] The following medication(s) was prescribed: Bactrim DS oral 800 mg-160 mg tablet 1 1 tablet oral twice a day x 30 days starting 06/15/2022 WOUND #1: - Ankle Wound Laterality: Right, Lateral Cleanser: Vashe 5.8 (oz) (Home Health) 3 x Per Week/30 Days Discharge Instructions: Cleanse the wound with Vashe prior to applying a clean dressing using gauze sponges, not tissue or cotton balls. Prim Dressing: MediHoney Gel, tube 1.5 (oz) (Home Health) 3 x Per Week/30 Days ary Discharge Instructions: Apply to wound bed as instructed Prim Dressing: Xeroform Occlusive Gauze Dressing, 4x4 in (Home Health) 3 x Per Week/30 Days ary Discharge Instructions: Apply to wound bed as instructed Secondary Dressing: Zetuvit Plus Silicone Border Dressing 4x4 (in/in) (Home Health) 3 x Per Week/30 Days Discharge Instructions: Apply silicone border over primary dressing as directed. WOUND #2: - T Great Wound Laterality: Dorsal, Right oe Cleanser: Vashe 5.8 (oz) (Home Health) 3 x Per Week/30 Days Discharge Instructions: Cleanse the wound with Vashe prior to applying a clean dressing using gauze sponges, not tissue or cotton balls. Prim Dressing: MediHoney Gel, tube 1.5 (oz) (Home Health) 3 x Per Week/30 Days ary Discharge Instructions: Apply to wound bed as instructed Prim Dressing: Xeroform Occlusive Gauze Dressing, 4x4 in (Home Health) 3 x Per Week/30 Days ary Discharge Instructions: Apply to wound bed as instructed Secondary Dressing: Bandaid (Home Health) 3 x Per Week/30 Days WOUND #3: - T Second Wound Laterality: Dorsal, Right oe Cleanser: Vashe 5.8 (oz) (Home Health) 3 x Per Week/30 Days Discharge Instructions: Cleanse the wound with Vashe prior to applying a clean dressing using gauze sponges, not tissue or cotton balls. Prim Dressing: MediHoney Gel, tube 1.5 (oz) (Home Health) 3 x Per Week/30 Days ary Discharge Instructions: Apply to wound  bed as instructed Prim Dressing: Xeroform Occlusive Gauze Dressing, 4x4 in (Home Health) 3 x Per Week/30 Days ary Discharge Instructions: Apply to wound bed as instructed Secondary Dressing: Bandaid (Home Health) 3 x Per Week/30 Days WOUND #4: - T Third Wound Laterality: Dorsal, Right oe Cleanser: Vashe 5.8 (oz) (Home Health) 3 x Per Week/30 Days Discharge Instructions: Cleanse the wound with Vashe prior to applying a clean dressing using gauze sponges, not tissue or cotton balls. Prim Dressing: MediHoney Gel, tube 1.5 (oz) (Home Health) 3 x Per Week/30 Days ary Discharge Instructions: Apply to wound bed as instructed Prim Dressing: Xeroform Occlusive Gauze Dressing, 4x4 in (Home Health) 3 x Per Week/30 Days ary Discharge Instructions: Apply to wound bed as instructed Secondary Dressing: Bandaid (Home Health) 3 x Per Week/30 Days WOUND #5: - Foot Wound Laterality: Right, Medial Cleanser: Vashe 5.8 (oz) 3 x Per Week/30 Days Discharge Instructions: Cleanse the wound with Vashe prior to applying a clean dressing using gauze sponges, not tissue or cotton balls. Secondary Dressing: Zetuvit Plus Silicone Border Dressing 4x4 (in/in) 3 x Per Week/30 Days Betty Morales, Betty Morales ([id number]) [id number] Page 8  of 9 Discharge Instructions: Apply silicone border over primary dressing as directed. WOUND #6: - T Great oe Wound Laterality: Dorsal, Left Cleanser: Vashe 5.8 (oz) (Home Health) 3 x Per Week/30 Days Discharge Instructions: Cleanse the wound with Vashe prior to applying a clean dressing using gauze sponges, not tissue or cotton balls. Prim Dressing: MediHoney Gel, tube 1.5 (oz) (Home Health) 3 x Per Week/30 Days ary Discharge Instructions: Apply to wound bed as instructed Prim Dressing: Xeroform Occlusive Gauze Dressing, 4x4 in (Home Health) 3 x Per Week/30 Days ary Discharge Instructions: Apply to wound bed as instructed Secondary Dressing: Bandaid (Home Health) 3 x  Per Week/30 Days WOUND #7: - T Third Wound Laterality: Dorsal, Left oe Cleanser: Vashe 5.8 (oz) (Home Health) 3 x Per Week/30 Days Discharge Instructions: Cleanse the wound with Vashe prior to applying a clean dressing using gauze sponges, not tissue or cotton balls. Prim Dressing: MediHoney Gel, tube 1.5 (oz) (Home Health) 3 x Per Week/30 Days ary Discharge Instructions: Apply to wound bed as instructed Prim Dressing: Xeroform Occlusive Gauze Dressing, 4x4 in (Home Health) 3 x Per Week/30 Days ary Discharge Instructions: Apply to wound bed as instructed Secondary Dressing: Bandaid (Home Health) 3 x Per Week/30 Days WOUND #8: - Calcaneus Wound Laterality: Left, Anterior Cleanser: Vashe 5.8 (oz) 3 x Per Week/30 Days Discharge Instructions: Cleanse the wound with Vashe prior to applying a clean dressing using gauze sponges, not tissue or cotton balls. Prim Dressing: Betadine 3 x Per Week/30 Days ary Secondary Dressing: Zetuvit Plus Silicone Border Dressing 4x4 (in/in) 3 x Per Week/30 Days Discharge Instructions: Apply silicone border over primary dressing as directed. WOUND #9: - T Fourth Wound Laterality: Left oe Cleanser: Vashe 5.8 (oz) 3 x Per Week/30 Days Discharge Instructions: Cleanse the wound with Vashe prior to applying a clean dressing using gauze sponges, not tissue or cotton balls. Prim Dressing: Betadine 3 x Per Week/30 Days ary Secondary Dressing: Zetuvit Plus Silicone Border Dressing 4x4 (in/in) 3 x Per Week/30 Days Discharge Instructions: Apply silicone border over primary dressing as directed. 1. On the recommend currently based on what we are seeing that we go ahead and initiate a continuation of treatment with the wound care measures as before with use of Medihoney along with Xeroform to try to keep the bone areas from drying out. In regard to the ankle and heel locations were drained using Betadine and dry gauze. In regard to the leg again when using the Medihoney and  Xeroform. 2. I do believe these dressings need to be changed 3 times per week we have home health coming out to help. 3. I am going to suggest that she should try to keep pressure off of her toes as well as the wounds on her leg. Obviously this is not the easiest thing to do and with her current health status she does not spend a lot of time being mobile and moving around but more sitting. This is not optimal for her. 4. The patient and her son will discuss and let me know in the coming weeks if they decide they want to proceed with seeing a surgeon about the possibility of amputation again I do not know if that is really the best option to be perfectly honest it may be that her best option would be to just kind of monitor and let things ride as it stands currently We will see patient back for reevaluation in 1 week here in the clinic. If anything worsens or changes  patient will contact our office for additional recommendations. Electronic Signature(s) Signed: 06/15/2022 5:03:32 PM By: Allen Derry PA-C Entered By: Allen Derry on 06/15/2022 17:03:31 -------------------------------------------------------------------------------- SuperBill Details Patient Name: Date of Service: Betty Morales, [place] RBA RA 06/15/2022 Medical Record Number: [id number] Patient Account Number: [id number] Date of Birth/Sex: Treating RN: March 23, 1940 (82 y.o. F) Primary Care Provider: Tracey Harries Other Clinician: Referring Provider: Treating Provider/Extender: Vaughan Sine in Treatment: 1 Diagnosis Coding ICD-10 Codes Code Description [phone] Type 2 diabetes mellitus with foot ulcer L97.312 Non-pressure chronic ulcer of right ankle with fat layer exposed 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 I10 Essential (primary) hypertension G81.93 Hemiplegia, unspecified affecting  right nondominant side I48.0 Paroxysmal atrial fibrillation J84.10 Pulmonary fibrosis, unspecified Z79.01 Long term (current) use of anticoagulants I25.10 Atherosclerotic heart disease of native coronary artery without angina pectoris Facility Procedures TAKEESHA, Betty Morales ([id number]): CPT4 Code Description [id number] [phone] - WOUND CARE VISIT-LEV 5 EST PT [id number] Page 9 of 9: Modifier Quantity 1 Physician Procedures : CPT4 Code Description Modifier [phone] [id number] - WC PHYS LEVEL 4 - EST PT ICD-10 Diagnosis Description E11.621 Type 2 diabetes mellitus with foot ulcer L97.312 Non-pressure chronic ulcer of right ankle with fat layer exposed L97.512 Non-pressure chronic  ulcer of other part of right foot with fat layer exposed L97.522 Non-pressure chronic ulcer of other part of left foot with fat layer exposed Quantity: 1 Electronic Signature(s) Signed: 06/15/2022 5:08:22 PM By: Karie Schwalbe RN Signed: 06/15/2022 5:22:52 PM By: Allen Derry PA-C Previous Signature: 06/15/2022 5:03:59 PM Version By: Allen Derry PA-C Entered By: Karie Schwalbe on 06/15/2022 17:04:58

## 2022-06-29 ENCOUNTER — Encounter (HOSPITAL_BASED_OUTPATIENT_CLINIC_OR_DEPARTMENT_OTHER): Payer: Medicare (Managed Care) | Admitting: Physician Assistant

## 2022-07-04 ENCOUNTER — Encounter (HOSPITAL_BASED_OUTPATIENT_CLINIC_OR_DEPARTMENT_OTHER): Payer: Medicare HMO | Admitting: Internal Medicine

## 2022-08-22 DEATH — deceased
